# Patient Record
Sex: Female | Born: 1975 | Race: White | Hispanic: No | Marital: Single | Smoking: Never smoker
Health system: Southern US, Community
[De-identification: ages and names within clinical notes are randomized; demographics above are authoritative.]

## PROBLEM LIST (undated history)

## (undated) DIAGNOSIS — K589 Irritable bowel syndrome without diarrhea: Secondary | ICD-10-CM

## (undated) DIAGNOSIS — J45909 Unspecified asthma, uncomplicated: Secondary | ICD-10-CM

## (undated) DIAGNOSIS — F419 Anxiety disorder, unspecified: Secondary | ICD-10-CM

## (undated) DIAGNOSIS — K219 Gastro-esophageal reflux disease without esophagitis: Secondary | ICD-10-CM

## (undated) DIAGNOSIS — S27809A Unspecified injury of diaphragm, initial encounter: Secondary | ICD-10-CM

## (undated) DIAGNOSIS — E0500 Thyrotoxicosis with diffuse goiter without thyrotoxic crisis or storm: Secondary | ICD-10-CM

## (undated) DIAGNOSIS — I7300 Raynaud's syndrome without gangrene: Secondary | ICD-10-CM

## (undated) DIAGNOSIS — F329 Major depressive disorder, single episode, unspecified: Secondary | ICD-10-CM

## (undated) DIAGNOSIS — N3010 Interstitial cystitis (chronic) without hematuria: Secondary | ICD-10-CM

## (undated) DIAGNOSIS — S0300XA Dislocation of jaw, unspecified side, initial encounter: Secondary | ICD-10-CM

## (undated) DIAGNOSIS — S36119A Unspecified injury of liver, initial encounter: Secondary | ICD-10-CM

## (undated) DIAGNOSIS — F429 Obsessive-compulsive disorder, unspecified: Secondary | ICD-10-CM

## (undated) DIAGNOSIS — F4310 Post-traumatic stress disorder, unspecified: Secondary | ICD-10-CM

## (undated) DIAGNOSIS — F32A Depression, unspecified: Secondary | ICD-10-CM

## (undated) DIAGNOSIS — T7840XA Allergy, unspecified, initial encounter: Secondary | ICD-10-CM

## (undated) HISTORY — DX: Depression, unspecified: F32.A

## (undated) HISTORY — PX: GASTRECTOMY: SHX58

## (undated) HISTORY — PX: SPLENECTOMY, TOTAL: SHX788

## (undated) HISTORY — PX: TURBINATE REDUCTION: SHX6157

## (undated) HISTORY — PX: BLADDER SURGERY: SHX569

## (undated) HISTORY — DX: Allergy, unspecified, initial encounter: T78.40XA

## (undated) HISTORY — PX: ABDOMINAL HYSTERECTOMY: SHX81

---
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
## 2015-03-20 DIAGNOSIS — IMO0002 Reserved for concepts with insufficient information to code with codable children: Secondary | ICD-10-CM | POA: Insufficient documentation

## 2016-01-02 DIAGNOSIS — E890 Postprocedural hypothyroidism: Secondary | ICD-10-CM | POA: Insufficient documentation

## 2016-01-02 DIAGNOSIS — I7300 Raynaud's syndrome without gangrene: Secondary | ICD-10-CM | POA: Insufficient documentation

## 2016-01-02 DIAGNOSIS — M26609 Unspecified temporomandibular joint disorder, unspecified side: Secondary | ICD-10-CM | POA: Insufficient documentation

## 2016-01-02 DIAGNOSIS — N3010 Interstitial cystitis (chronic) without hematuria: Secondary | ICD-10-CM | POA: Insufficient documentation

## 2016-01-02 DIAGNOSIS — Z8639 Personal history of other endocrine, nutritional and metabolic disease: Secondary | ICD-10-CM | POA: Insufficient documentation

## 2016-01-02 DIAGNOSIS — K589 Irritable bowel syndrome without diarrhea: Secondary | ICD-10-CM | POA: Insufficient documentation

## 2016-01-02 DIAGNOSIS — Z923 Personal history of irradiation: Secondary | ICD-10-CM | POA: Insufficient documentation

## 2016-03-30 DIAGNOSIS — R102 Pelvic and perineal pain: Secondary | ICD-10-CM | POA: Insufficient documentation

## 2016-11-26 DIAGNOSIS — M6289 Other specified disorders of muscle: Secondary | ICD-10-CM | POA: Insufficient documentation

## 2017-10-01 DIAGNOSIS — T1490XA Injury, unspecified, initial encounter: Secondary | ICD-10-CM | POA: Insufficient documentation

## 2017-10-01 DIAGNOSIS — E039 Hypothyroidism, unspecified: Secondary | ICD-10-CM | POA: Insufficient documentation

## 2017-10-01 DIAGNOSIS — W3400XA Accidental discharge from unspecified firearms or gun, initial encounter: Secondary | ICD-10-CM | POA: Insufficient documentation

## 2017-10-04 DIAGNOSIS — F429 Obsessive-compulsive disorder, unspecified: Secondary | ICD-10-CM | POA: Insufficient documentation

## 2017-10-04 DIAGNOSIS — S27809A Unspecified injury of diaphragm, initial encounter: Secondary | ICD-10-CM | POA: Insufficient documentation

## 2017-10-04 DIAGNOSIS — S3633XA Laceration of stomach, initial encounter: Secondary | ICD-10-CM | POA: Insufficient documentation

## 2017-10-04 DIAGNOSIS — S36119A Unspecified injury of liver, initial encounter: Secondary | ICD-10-CM | POA: Insufficient documentation

## 2017-10-04 DIAGNOSIS — K219 Gastro-esophageal reflux disease without esophagitis: Secondary | ICD-10-CM | POA: Insufficient documentation

## 2017-11-24 DIAGNOSIS — Z5189 Encounter for other specified aftercare: Secondary | ICD-10-CM | POA: Insufficient documentation

## 2020-01-17 ENCOUNTER — Other Ambulatory Visit: Payer: Self-pay

## 2020-01-17 ENCOUNTER — Emergency Department (INDEPENDENT_AMBULATORY_CARE_PROVIDER_SITE_OTHER): Payer: Federal, State, Local not specified - PPO

## 2020-01-17 ENCOUNTER — Encounter: Payer: Self-pay | Admitting: *Deleted

## 2020-01-17 ENCOUNTER — Emergency Department
Admission: EM | Admit: 2020-01-17 | Discharge: 2020-01-17 | Disposition: A | Discharge status: Home / Self Care | Payer: Self-pay | Source: Home / Self Care | Attending: Family Medicine | Admitting: Family Medicine

## 2020-01-17 DIAGNOSIS — S92411A Displaced fracture of proximal phalanx of right great toe, initial encounter for closed fracture: Secondary | ICD-10-CM | POA: Diagnosis not present

## 2020-01-17 DIAGNOSIS — S92414A Nondisplaced fracture of proximal phalanx of right great toe, initial encounter for closed fracture: Secondary | ICD-10-CM | POA: Diagnosis not present

## 2020-01-17 DIAGNOSIS — W208XXA Other cause of strike by thrown, projected or falling object, initial encounter: Secondary | ICD-10-CM | POA: Diagnosis not present

## 2020-01-17 HISTORY — DX: Gastro-esophageal reflux disease without esophagitis: K21.9

## 2020-01-17 HISTORY — DX: Unspecified injury of liver, initial encounter: S36.119A

## 2020-01-17 HISTORY — DX: Interstitial cystitis (chronic) without hematuria: N30.10

## 2020-01-17 HISTORY — DX: Raynaud's syndrome without gangrene: I73.00

## 2020-01-17 HISTORY — DX: Irritable bowel syndrome, unspecified: K58.9

## 2020-01-17 HISTORY — DX: Post-traumatic stress disorder, unspecified: F43.10

## 2020-01-17 HISTORY — DX: Dislocation of jaw, unspecified side, initial encounter: S03.00XA

## 2020-01-17 HISTORY — DX: Thyrotoxicosis with diffuse goiter without thyrotoxic crisis or storm: E05.00

## 2020-01-17 HISTORY — DX: Anxiety disorder, unspecified: F41.9

## 2020-01-17 HISTORY — DX: Obsessive-compulsive disorder, unspecified: F42.9

## 2020-01-17 HISTORY — DX: Unspecified injury of diaphragm, initial encounter: S27.809A

## 2020-01-17 MED ORDER — HYDROCODONE-ACETAMINOPHEN 5-325 MG PO TABS: ORAL_TABLET | ORAL | 0 refills | Status: DC

## 2020-01-17 NOTE — ED Triage Notes (Signed)
Pt c/o RT great toe and foot injury x 10 days ago.

## 2020-01-17 NOTE — ED Provider Notes (Signed)
[facility]    CSN: [id number] Arrival date & time: 01/17/20  1962      History   Chief Complaint Chief Complaint  Patient presents with  . Foot Pain    HPI Alisha Smith is a 44 y.o. female.   Patient states that 12 days ago a heavy board fell on her right great toe and distal right foot.  The initial swelling has gradually improved but she has had persistent pain of the great toe.  The history is provided by the patient.  Toe Pain This is a new problem. The current episode started more than 1 week ago. The problem occurs constantly. The problem has not changed since onset.The symptoms are aggravated by walking and standing. Nothing relieves the symptoms. She has tried nothing for the symptoms.    Past Medical History:  Diagnosis Date  . Anxiety   . Diaphragm injury   . GERD (gastroesophageal reflux disease)   . Graves disease   . IBS (irritable bowel syndrome)   . Interstitial cystitis   . Liver injury   . OCD (obsessive compulsive disorder)   . PTSD (post-traumatic stress disorder)   . Raynaud disease   . TMJ (dislocation of temporomandibular joint)     There are no problems to display for this patient.   Past Surgical History:  Procedure Laterality Date  . ABDOMINAL HYSTERECTOMY    . BLADDER SURGERY    . GASTRECTOMY    . SPLENECTOMY, TOTAL    . TURBINATE REDUCTION      OB History   No obstetric history on file.      Home Medications    Prior to Admission medications   Medication Sig Start Date End Date Taking? Authorizing Provider  cetirizine (ZYRTEC) 10 MG tablet Take by mouth. 11/26/16  Yes [provider]  levothyroxine (SYNTHROID) 100 MCG tablet TAKE ONE TABLET (100 MCG) DAILY 06/12/19  Yes [provider]  omeprazole (PRILOSEC) 20 MG capsule Take 20 mg by mouth daily.   Yes [provider]  Biotin 1 MG CAPS Take by mouth.    [provider]  HYDROcodone-acetaminophen (NORCO/VICODIN) 5-325 MG  tablet Take one by mouth at bedtime as needed for pain 01/17/20   Lattie Haw, MD  Multiple Vitamin (MULTIVITAMIN) capsule Take by mouth.    [provider]  TRINTELLIX 20 MG TABS tablet Take 20 mg by mouth daily. 12/31/19   [provider]    Family History Family History  Problem Relation Age of Onset  . Diabetes Father     Social History Social History   Tobacco Use  . Smoking status: Never Smoker  . Smokeless tobacco: Never Used  Substance Use Topics  . Alcohol use: Not Currently  . Drug use: Never     Allergies   Patient has no known allergies.   Review of Systems Review of Systems  Musculoskeletal: Positive for joint swelling.  All other systems reviewed and are negative.    Physical Exam Triage Vital Signs ED Triage Vitals  Enc Vitals Group     BP 01/17/20 1015 130/82     Pulse Rate 01/17/20 1015 69     Resp 01/17/20 1015 16     Temp 01/17/20 1015 97.7 F (36.5 C)     Temp Source 01/17/20 1015 Oral     SpO2 01/17/20 1015 99 %     Weight 01/17/20 1016 141 lb (64 kg)     Height 01/17/20 1016 5' 3.5" (1.613  m)     Head Circumference --      Peak Flow --      Pain Score 01/17/20 1015 4     Pain Loc --      Pain Edu? --      Excl. in GC? --    No data found.  Updated Vital Signs BP 130/82 (BP Location: Right Arm)   Pulse 69   Temp 97.7 F (36.5 C) (Oral)   Resp 16   Ht 5' 3.5" (1.613 m)   Wt 64 kg   SpO2 99%   BMI 24.59 kg/m   Visual Acuity Right Eye Distance:   Left Eye Distance:   Bilateral Distance:    Right Eye Near:   Left Eye Near:    Bilateral Near:     Physical Exam Vitals and nursing note reviewed.  Constitutional:      General: She is not in acute distress. HENT:     Head: Normocephalic.  Eyes:     Pupils: Pupils are equal, round, and reactive to light.  Cardiovascular:     Rate and Rhythm: Normal rate.  Pulmonary:     Effort: Pulmonary effort is normal.  Musculoskeletal:       Feet:      Comments: Right great toe has tenderness to palpation over the IP joint and proximal phalanx distally.  Flexion/extension is intact.   Skin:    General: Skin is warm and dry.  Neurological:     Mental Status: She is alert.      UC Treatments / Results  Labs (all labs ordered are listed, but only abnormal results are displayed) Labs Reviewed - No data to display  EKG   Radiology DG Foot Complete Right  Result Date: 01/17/2020 CLINICAL DATA:  Board fell on foot with pain EXAM: RIGHT FOOT COMPLETE - 3+ VIEW COMPARISON:  None. FINDINGS: Frontal, oblique, and lateral views were obtained. There is a fracture along the lateral aspect of the distal portion of the first proximal phalanx with extension of the fracture in the first IP joint. Fracture fragments are in near anatomic alignment. No other fractures are evident. No dislocation. Joint spaces appear normal. No erosive change. IMPRESSION: Comminuted fracture of the lateral aspect of the distal portion of the first proximal phalanx with fracture fragments in near anatomic alignment. No other fractures are evident. No dislocation. No appreciable arthropathy. These results will be called to the ordering clinician or representative by the Radiologist Assistant, and communication documented in the PACS or zVision Dashboard. Electronically Signed   By: Bretta Bang III M.D.   On: 01/17/2020 10:41    Procedures Procedures (including critical care time)  Medications Ordered in UC Medications - No data to display  Initial Impression / Assessment and Plan / UC Course  I have reviewed the triage vital signs and the nursing notes.  Pertinent labs & imaging results that were available during my care of the patient were reviewed by me and considered in my medical decision making (see chart for details).    Dispensed post-op shoe.  Rx for Lortab at bedtime (#5, no refill) Controlled Substance Prescriptions I have consulted the [place] Controlled  Substances Registry for this patient, and feel the risk/benefit ratio today is favorable for proceeding with this prescription for a controlled substance.   Followup with Dr. Rodney [facility]ton ([facility]) for fracture management.   Final Clinical Impressions(s) / UC Diagnoses   Final diagnoses:  Closed nondisplaced fracture of proximal  phalanx of right great toe, initial encounter     Discharge Instructions     Wear post-op shoe.  May take Tylenol daytime as needed for pain    ED Prescriptions    Medication Sig Dispense Auth. Provider   HYDROcodone-acetaminophen (NORCO/VICODIN) 5-325 MG tablet Take one by mouth at bedtime as needed for pain 5 tablet Kandra Nicolas, MD        Kandra Nicolas, MD 01/17/20 [phone]

## 2020-01-17 NOTE — Discharge Instructions (Signed)
Wear post-op shoe.  May take Tylenol daytime as needed for pain

## 2020-01-19 ENCOUNTER — Other Ambulatory Visit: Payer: Self-pay

## 2020-01-19 ENCOUNTER — Encounter: Payer: Self-pay | Admitting: Sports Medicine

## 2020-01-19 ENCOUNTER — Ambulatory Visit (INDEPENDENT_AMBULATORY_CARE_PROVIDER_SITE_OTHER): Payer: Federal, State, Local not specified - PPO

## 2020-01-19 ENCOUNTER — Ambulatory Visit (INDEPENDENT_AMBULATORY_CARE_PROVIDER_SITE_OTHER): Payer: Federal, State, Local not specified - PPO | Admitting: Sports Medicine

## 2020-01-19 DIAGNOSIS — S92414A Nondisplaced fracture of proximal phalanx of right great toe, initial encounter for closed fracture: Secondary | ICD-10-CM

## 2020-01-19 DIAGNOSIS — S92401A Displaced unspecified fracture of right great toe, initial encounter for closed fracture: Secondary | ICD-10-CM | POA: Insufficient documentation
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
## 2020-02-16 ENCOUNTER — Encounter: Payer: Self-pay | Admitting: Sports Medicine

## 2020-02-16 ENCOUNTER — Ambulatory Visit (INDEPENDENT_AMBULATORY_CARE_PROVIDER_SITE_OTHER): Payer: Federal, State, Local not specified - PPO

## 2020-02-16 ENCOUNTER — Other Ambulatory Visit: Payer: Self-pay

## 2020-02-16 ENCOUNTER — Ambulatory Visit (INDEPENDENT_AMBULATORY_CARE_PROVIDER_SITE_OTHER): Payer: Federal, State, Local not specified - PPO | Admitting: Sports Medicine

## 2020-02-16 DIAGNOSIS — S92414A Nondisplaced fracture of proximal phalanx of right great toe, initial encounter for closed fracture: Secondary | ICD-10-CM

## 2020-02-16 DIAGNOSIS — S92414D Nondisplaced fracture of proximal phalanx of right great toe, subsequent encounter for fracture with routine healing: Secondary | ICD-10-CM

## 2020-02-16 NOTE — Assessment & Plan Note (Signed)
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 She had walked on it for a week, and she came to see me and we placed her in a postop shoe, she returns today doing much better, x-rays were personally reviewed and show stability of the fracture, she does have signs of new bone formation with callus seen around the proximal end of the fracture. There is a question of a slight increase in joint incongruity but I think this is artifactual and related to angle. Continue minimal weightbearing, though she has been in her regular shoe, and I like to see her back in a month with repeat x-rays obtained at that time.

## 2020-02-16 NOTE — Progress Notes (Signed)
   Impression and Recommendations:    I have performed independent interpretation of the relevant labs and imaging ordered by this patient's other providers.  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 She had walked on it for a week, and she came to see me and we placed her in a postop shoe, she returns today doing much better, x-rays were personally reviewed and show stability of the fracture, she does have signs of new bone formation with callus seen around the proximal end of the fracture. There is a question of a slight increase in joint incongruity but I think this is artifactual and related to angle. Continue minimal weightbearing, though she has been in her regular shoe, and I like to see her back in a month with repeat x-rays obtained at that time.    ___________________________________________ Ihor Austin. Benjamin Stain, M.D., ABFM., CAQSM. Primary Care and Sports Medicine [facility]  Adjunct Professor of Family Medicine  [employer]

## 2020-03-20 ENCOUNTER — Other Ambulatory Visit: Payer: Self-pay

## 2020-03-20 ENCOUNTER — Ambulatory Visit (INDEPENDENT_AMBULATORY_CARE_PROVIDER_SITE_OTHER): Payer: Federal, State, Local not specified - PPO

## 2020-03-20 ENCOUNTER — Encounter: Payer: Self-pay | Admitting: Sports Medicine

## 2020-03-20 ENCOUNTER — Ambulatory Visit (INDEPENDENT_AMBULATORY_CARE_PROVIDER_SITE_OTHER): Payer: Federal, State, Local not specified - PPO | Admitting: Sports Medicine

## 2020-03-20 DIAGNOSIS — S92414D Nondisplaced fracture of proximal phalanx of right great toe, subsequent encounter for fracture with routine healing: Secondary | ICD-10-CM

## 2020-03-20 NOTE — Assessment & Plan Note (Signed)
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 Today her fracture continues to look improved, there is decreased prominence of the fracture line, good bony callus, and she only has minimal tenderness around the fracture. I think she can return to see me on an as-needed basis.

## 2020-03-20 NOTE — Progress Notes (Signed)
    Procedures performed today:    None.  Independent interpretation of notes and tests performed by another provider:   None.  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 Today her fracture continues to look improved, there is decreased prominence of the fracture line, good bony callus, and she only has minimal tenderness around the fracture. I think she can return to see me on an as-needed basis.    ___________________________________________ Ihor Austin. Benjamin Stain, M.D., ABFM., CAQSM. Primary Care and Sports Medicine [facility]  Adjunct Instructor of Family Medicine  [employer]

## 2020-04-29 DIAGNOSIS — U071 COVID-19: Secondary | ICD-10-CM | POA: Insufficient documentation

## 2020-07-23 DIAGNOSIS — K439 Ventral hernia without obstruction or gangrene: Secondary | ICD-10-CM | POA: Insufficient documentation

## 2020-08-14 DIAGNOSIS — R002 Palpitations: Secondary | ICD-10-CM | POA: Insufficient documentation

## 2020-08-15 DIAGNOSIS — U099 Post covid-19 condition, unspecified: Secondary | ICD-10-CM | POA: Insufficient documentation

## 2020-08-26 DIAGNOSIS — K8020 Calculus of gallbladder without cholecystitis without obstruction: Secondary | ICD-10-CM | POA: Insufficient documentation

## 2020-12-24 DIAGNOSIS — F32A Depression, unspecified: Secondary | ICD-10-CM | POA: Insufficient documentation

## 2021-01-28 ENCOUNTER — Other Ambulatory Visit: Payer: Self-pay | Admitting: Internal Medicine

## 2021-01-28 DIAGNOSIS — Z1231 Encounter for screening mammogram for malignant neoplasm of breast: Secondary | ICD-10-CM

## 2021-04-10 ENCOUNTER — Ambulatory Visit
Admission: RE | Admit: 2021-04-10 | Discharge: 2021-04-10 | Disposition: A | Discharge status: Home / Self Care | Payer: Federal, State, Local not specified - PPO | Source: Ambulatory Visit | Attending: Internal Medicine | Admitting: Internal Medicine

## 2021-04-10 ENCOUNTER — Other Ambulatory Visit: Payer: Self-pay

## 2021-04-10 DIAGNOSIS — Z1231 Encounter for screening mammogram for malignant neoplasm of breast: Secondary | ICD-10-CM

## 2021-07-02 ENCOUNTER — Other Ambulatory Visit: Payer: Self-pay

## 2021-07-02 ENCOUNTER — Emergency Department (HOSPITAL_COMMUNITY)
Admission: EM | Admit: 2021-07-02 | Discharge: 2021-07-02 | Disposition: A | Discharge status: Home / Self Care | Payer: Federal, State, Local not specified - PPO | Attending: Emergency Medicine | Admitting: Emergency Medicine

## 2021-07-02 ENCOUNTER — Encounter (HOSPITAL_COMMUNITY): Payer: Self-pay | Admitting: Emergency Medicine

## 2021-07-02 DIAGNOSIS — Z79899 Other long term (current) drug therapy: Secondary | ICD-10-CM | POA: Insufficient documentation

## 2021-07-02 DIAGNOSIS — Z91010 Allergy to peanuts: Secondary | ICD-10-CM | POA: Insufficient documentation

## 2021-07-02 DIAGNOSIS — E890 Postprocedural hypothyroidism: Secondary | ICD-10-CM | POA: Diagnosis not present

## 2021-07-02 DIAGNOSIS — L03116 Cellulitis of left lower limb: Secondary | ICD-10-CM

## 2021-07-02 DIAGNOSIS — J45909 Unspecified asthma, uncomplicated: Secondary | ICD-10-CM | POA: Diagnosis not present

## 2021-07-02 DIAGNOSIS — Z91040 Latex allergy status: Secondary | ICD-10-CM | POA: Diagnosis not present

## 2021-07-02 DIAGNOSIS — M79672 Pain in left foot: Secondary | ICD-10-CM | POA: Diagnosis present

## 2021-07-02 HISTORY — DX: Unspecified asthma, uncomplicated: J45.909

## 2021-07-02 MED ORDER — STERILE WATER FOR INJECTION IJ SOLN
INTRAMUSCULAR | Status: AC
  Filled 2021-07-02: qty 10

## 2021-07-02 MED ORDER — CEPHALEXIN 500 MG PO CAPS: 500.0000 mg | ORAL_CAPSULE | Freq: Four times a day (QID) | ORAL | 0 refills | Status: DC

## 2021-07-02 MED ORDER — CEFTRIAXONE SODIUM 1 G IJ SOLR
500.0000 mg | Freq: Once | INTRAMUSCULAR | Status: AC
  Administered 2021-07-02: 500 mg via INTRAMUSCULAR
  Filled 2021-07-02: qty 10

## 2021-07-02 NOTE — ED Triage Notes (Addendum)
Pt reports left foot pain that began 7/29. Was seen by PCP, but sx have not resolved. Left foot is swollen and warm to touch. Pain and swelling spreading up leg into calf area. Denies HX of blood clots. Not on BC. Denies injury to area.

## 2021-07-02 NOTE — Discharge Instructions (Addendum)
We saw in the ER for swelling of your foot.  It appears to [place] that you likely have cellulitis.  Take the antibiotics prescribed starting tonight.  See your primary care doctor in 3 to 5 days if the symptoms are not improving.  Return to the ER immediately if you start having worsening of the swelling, or you start having chills, sweats, and high-grade fevers.
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
## 2021-07-10 ENCOUNTER — Encounter (HOSPITAL_COMMUNITY): Payer: Self-pay

## 2021-07-10 ENCOUNTER — Other Ambulatory Visit: Payer: Self-pay

## 2021-07-10 ENCOUNTER — Ambulatory Visit (HOSPITAL_COMMUNITY)
Admission: EM | Admit: 2021-07-10 | Discharge: 2021-07-10 | Disposition: A | Discharge status: Home / Self Care | Payer: Federal, State, Local not specified - PPO | Attending: Emergency Medicine | Admitting: Emergency Medicine

## 2021-07-10 DIAGNOSIS — L03119 Cellulitis of unspecified part of limb: Secondary | ICD-10-CM | POA: Diagnosis not present

## 2021-07-10 MED ORDER — CEPHALEXIN 500 MG PO CAPS: 500.0000 mg | ORAL_CAPSULE | Freq: Four times a day (QID) | ORAL | 0 refills | Status: AC

## 2021-07-10 MED ORDER — HYDROCODONE-ACETAMINOPHEN 5-325 MG PO TABS: 1.0000 | ORAL_TABLET | Freq: Four times a day (QID) | ORAL | 0 refills | Status: DC | PRN

## 2021-07-10 NOTE — ED Triage Notes (Signed)
Pt presents with left foot pain and swelling. States she was diagnosed with cellulitis. States Saturday she felt better tham other days and states she was given Keflex.
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
## 2021-07-10 NOTE — Discharge Instructions (Addendum)
I am extending your Keflex out to 14 days.  You were originally prescribed 7-day course.  If your foot is completely better by day #10, then you can discontinue the Keflex.  Otherwise, finish at all.  Try topical Voltaren as directed for pain and swelling.  Get Tylenol containing product 3-4 times a day as needed for pain.  Either 1000 mg of Tylenol for mild to moderate pain or one half Norco for severe pain.  Do not take Tylenol and Norco as they both have Tylenol in them.  To much Tylenol can hurt your liver.  Do not exceed more than 4000 mg of Tylenol from all sources in 1 day.  Follow-up with your doctor as needed.  Go to the ER if you start having fevers, worsening symptoms, or other concern

## 2021-07-20 ENCOUNTER — Telehealth: Payer: Federal, State, Local not specified - PPO | Admitting: Physician Assistant

## 2021-07-20 DIAGNOSIS — U071 COVID-19: Secondary | ICD-10-CM | POA: Diagnosis not present

## 2021-07-20 MED ORDER — PREDNISONE 20 MG PO TABS
40.0000 mg | ORAL_TABLET | Freq: Every day | ORAL | 0 refills | Status: DC
Start: 2021-07-20 — End: 2024-02-22

## 2021-07-20 MED ORDER — FLUTICASONE PROPIONATE 50 MCG/ACT NA SUSP: 2.0000 | Freq: Every day | NASAL | 0 refills | Status: DC

## 2021-07-20 MED ORDER — BENZONATATE 100 MG PO CAPS
100.0000 mg | ORAL_CAPSULE | Freq: Three times a day (TID) | ORAL | 0 refills | Status: DC | PRN
Start: 2021-07-20 — End: 2024-02-22

## 2021-07-20 MED ORDER — MOLNUPIRAVIR EUA 200MG CAPSULE: 4.0000 | ORAL_CAPSULE | Freq: Two times a day (BID) | ORAL | 0 refills | Status: AC

## 2021-07-20 NOTE — Patient Instructions (Signed)
Hello Alisha Smith,  You are being placed in the home monitoring program for COVID-19 (commonly known as Coronavirus).  This is because you are suspected to have the virus or are known to have the virus.  If you are unsure which group you fall into call your clinic.    As part of this program, you'll answer a daily questionnaire in the MyChart mobile app. You'll receive a notification through the MyChart app when the questionnaire is available. When you log in to MyChart, you'll see the tasks in your To Do activity.       Clinicians will see any answers that are concerning and take appropriate steps.  If at any point you are having a medical emergency, call 911.  If otherwise concerned call your clinic instead of coming into the clinic or hospital.  To keep from spreading the disease you should: Stay home and limit contact with other people as much as possible.  Wash your hands frequently. Cover your coughs and sneezes with a tissue, and throw used tissues in the trash.   Clean and disinfect frequently touched surfaces and objects.    Take care of yourself by: Staying home Resting Drinking fluids Take fever-reducing medications (Tylenol/Acetaminophen and Ibuprofen)  For more information on the disease go to the Centers for Disease Control and Prevention website     COVID-19: What to Do if You Are Sick CDC has updated isolation and quarantine recommendations for the public, and is revising the CDC website to reflect these changes. These recommendations do not apply to healthcare personnel and do not supersede state, local, tribal, or territorial laws, rules, andregulations. If you have a fever, cough or other symptoms, you might have COVID-19. Most people have mild illness and are able to recover at home. If you are sick: Keep track of your symptoms. If you have an emergency warning sign (including trouble breathing), call 911. Steps to help prevent the spread of COVID-19 if you are sick If  you are sick with COVID-19 or think you might have COVID-19, follow the steps below to care for yourself and to help protect other peoplein your home and community. Stay home except to get medical care Stay home. Most people with COVID-19 have mild illness and can recover at home without medical care. Do not leave your home, except to get medical care. Do not visit public areas. Take care of yourself. Get rest and stay hydrated. Take over-the-counter medicines, such as acetaminophen, to help you feel better. Stay in touch with your doctor. Call before you get medical care. Be sure to get care if you have trouble breathing, or have any other emergency warning signs, or if you think it is an emergency. Avoid public transportation, ride-sharing, or taxis. Separate yourself from other people As much as possible, stay in a specific room and away from other people and pets in your home. If possible, you should use a separate bathroom. If you need to be around other people or animals in oroutside of the home, wear a mask. Tell your close contactsthat they may have been exposed to COVID-19. An infected person can spread COVID-19 starting 48 hours (or 2 days) before the person has any symptoms or tests positive. By letting your close contacts know they may have been exposed to COVID-19, you are helping to protect everyone. Additional guidance is available for those living in close quarters and shared housing. See COVID-19 and Animals if you have questions about pets. If you are diagnosed with  COVID-19, someone from the health department may call you. Answer the call to slow the spread. Monitor your symptoms Symptoms of COVID-19 include fever, cough, or other symptoms. Follow care instructions from your healthcare provider and local health department. Your local health authorities may give instructions on checking your symptoms and reporting information. When to seek emergency medical attention Look for  emergency warning signs* for COVID-19. If someone is showing any of these signs, seek emergency medical care immediately: Trouble breathing Persistent pain or pressure in the chest New confusion Inability to wake or stay awake Pale, gray, or blue-colored skin, lips, or nail beds, depending on skin tone *This list is not all possible symptoms. Please call your medical provider forany other symptoms that are severe or concerning to you. Call 911 or call ahead to your local emergency facility: Notify the operator that you are seeking care for someone who has or may haveCOVID-19. Call ahead before visiting your doctor Call ahead. Many medical visits for routine care are being postponed or done by phone or telemedicine. If you have a medical appointment that cannot be postponed, call your doctor's office, and tell them you have or may have COVID-19. This will help the office protect themselves and other patients. Get tested If you have symptoms of COVID-19, get tested. While waiting for test results, you stay away from others, including staying apart from those living in your household. Self-tests are one of several options for testing for the virus that causes COVID-19 and may be more convenient than laboratory-based tests and point-of-care tests. Ask your healthcare provider or your local health department if you need help interpreting your test results. You can visit your state, tribal, local, and territorial health department's website to look for the latest local information on testing sites. If you are sick, wear a mask over your nose and mouth You should wear a mask over your nose and mouth if you must be around other people or animals, including pets (even at home). You don't need to wear the mask if you are alone. If you can't put on a mask (because of trouble breathing, for example), cover your coughs and sneezes in some other way. Try to stay at least 6 feet away from other people. This will  help protect the people around you. Masks should not be placed on young children under age 2 years, anyone who has trouble breathing, or anyone who is not able to remove the mask without help. Note: During the COVID-19 pandemic, medical grade facemasks are reserved forhealthcare workers and some first responders. Cover your coughs and sneezes Cover your mouth and nose with a tissue when you cough or sneeze. Throw away used tissues in a lined trash can. Immediately wash your hands with soap and water for at least 20 seconds. If soap and water are not available, clean your hands with an alcohol-based hand sanitizer that contains at least 60% alcohol. Clean your hands often Wash your hands often with soap and water for at least 20 seconds. This is especially important after blowing your nose, coughing, or sneezing; going to the bathroom; and before eating or preparing food. Use hand sanitizer if soap and water are not available. Use an alcohol-based hand sanitizer with at least 60% alcohol, covering all surfaces of your hands and rubbing them together until they feel dry. Soap and water are the best option, especially if hands are visibly dirty. Avoid touching your eyes, nose, and mouth with unwashed hands. Handwashing Tips Avoid sharing   personal household items Do not share dishes, drinking glasses, cups, eating utensils, towels, or bedding with other people in your home. Wash these items thoroughly after using them with soap and water or put in the dishwasher. Clean all "high-touch" surfaces every day Clean and disinfect high-touch surfaces in your "sick room" and bathroom; wear disposable gloves. Let someone else clean and disinfect surfaces in common areas, but you should clean your bedroom and bathroom, if possible. If a caregiver or other person needs to clean and disinfect a sick person's bedroom or bathroom, they should do so on an as-needed basis. The caregiver/other person should wear a mask  and disposable gloves prior to cleaning. They should wait as long as possible after the person who is sick has used the bathroom before coming in to clean and use the bathroom. High-touch surfaces include phones, remote controls, counters, tabletops, doorknobs, bathroom fixtures, toilets, keyboards, tablets, and bedside tables. Clean and disinfect areas that may have blood, stool, or body fluids on them. Use household cleaners and disinfectants. Clean the area or item with soap and water or another detergent if it is dirty. Then, use a household disinfectant. Be sure to follow the instructions on the label to ensure safe and effective use of the product. Many products recommend keeping the surface wet for several minutes to ensure germs are killed. Many also recommend precautions such as wearing gloves and making sure you have good ventilation during use of the product. Use a product from [employer] List [employer]: Disinfectants for Coronavirus ([id number]). Complete Disinfection Guidance When you can be around others after being sick with COVID-19 Deciding when you can be around others is different for different situations. Find out when you can safely end home isolation. For any additional questions about your care,contact your healthcare provider or state or local health department. 11/06/2020 Content source: [facility] ([facility]), Division of Viral Diseases This information is not intended to replace advice given to you by your health care provider. Make sure you discuss any questions you have with your healthcare provider. Document Revised: 01/03/2021 Document Reviewed: 01/03/2021 Elsevier Patient Education  2022 [place] are being prescribed MOLNUPIRAVIR for COVID-19 infection.   Please call the pharmacy or go through the drive through vs going inside if you are picking up the mediation yourself to prevent further spread. If prescribed to a [facility] affiliated pharmacy, a pharmacist will bring the medication out to your car.   ADMINISTRATION INSTRUCTIONS: Take with or without food. Swallow the tablets whole. Don't chew, crush, or break the medications because it might not work as well  For each dose of the medication, you should be taking FOUR tablets at one time, TWICE a day   Finish your full five-day course of Molnupiravir even if you feel better before you're done. Stopping this medication too early can make it less effective to prevent severe illness related to [place].    Molnupiravir is prescribed for YOU ONLY. Don't share it with others, even if they have similar symptoms as you. This medication might not be right for everyone.   Make sure to take steps to protect yourself and others while you're taking this medication in order to get well soon and to prevent others from getting sick with COVID-19.   **If you are of childbearing potential (any gender) - it is advised to not get pregnant while taking this medication and recommended that condoms are used for female partners the  next 3 months after taking the medication out of extreme caution    COMMON SIDE EFFECTS: Diarrhea Nausea  Dizziness    If your COVID-19 symptoms get worse, get medical help right away. Call 911 if you experience symptoms such as worsening cough, trouble breathing, chest pain that doesn't go away, confusion, a hard time staying awake, and pale or blue-colored skin. This medication won't prevent all COVID-19 cases from getting worse.   Can take to lessen severity: Vit C '500mg'$  twice daily Quercertin 250-'500mg'$  twice daily Zinc 75-'100mg'$  daily Melatonin 3-6 mg at bedtime Vit D3 1000-2000 IU daily Aspirin 81 mg daily with food Optional: Famotidine '20mg'$  daily Also can add tylenol/ibuprofen as needed for fevers and body aches May add Mucinex or Mucinex DM as needed for cough/congestion  10 Things You Can Do to Manage Your COVID-19 Symptoms at  Home If you have possible or confirmed COVID-19 Stay home except to get medical care. Monitor your symptoms carefully. If your symptoms get worse, call your healthcare provider immediately. Get rest and stay hydrated. If you have a medical appointment, call the healthcare provider ahead of time and tell them that you have or may have COVID-19. For medical emergencies, call 911 and notify the dispatch personnel that you have or may have COVID-19. Cover your cough and sneezes with a tissue or use the inside of your elbow. Wash your hands often with soap and water for at least 20 seconds or clean your hands with an alcohol-based hand sanitizer that contains at least 60% alcohol. As much as possible, stay in a specific room and away from other people in your home. Also, you should use a separate bathroom, if available. If you need to be around other people in or outside of the home, wear a mask. Avoid sharing personal items with other people in your household, like dishes, towels, and bedding. Clean all surfaces that are touched often, like counters, tabletops, and doorknobs. Use household cleaning sprays or wipes according to the label instructions. June 06/14/2020 This information is not intended to replace advice given to you by your health care provider. Make sure you discuss any questions you have with your healthcare provider. Document Revised: 01/03/2021 Document Reviewed: 01/03/2021 Elsevier Patient Education  [place].
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
## 2021-07-20 NOTE — Progress Notes (Signed)
Patient was notified x 3 and provider waited over 20 minutes without any attempt to connect. Marked No Show

## 2021-08-07 ENCOUNTER — Emergency Department (HOSPITAL_COMMUNITY): Payer: Federal, State, Local not specified - PPO

## 2021-08-07 ENCOUNTER — Emergency Department (HOSPITAL_COMMUNITY)
Admission: EM | Admit: 2021-08-07 | Discharge: 2021-08-08 | Disposition: A | Discharge status: Home / Self Care | Payer: Federal, State, Local not specified - PPO | Attending: Emergency Medicine | Admitting: Emergency Medicine

## 2021-08-07 ENCOUNTER — Other Ambulatory Visit: Payer: Self-pay

## 2021-08-07 DIAGNOSIS — E039 Hypothyroidism, unspecified: Secondary | ICD-10-CM | POA: Insufficient documentation

## 2021-08-07 DIAGNOSIS — D72829 Elevated white blood cell count, unspecified: Secondary | ICD-10-CM | POA: Insufficient documentation

## 2021-08-07 DIAGNOSIS — Z91040 Latex allergy status: Secondary | ICD-10-CM | POA: Insufficient documentation

## 2021-08-07 DIAGNOSIS — J45909 Unspecified asthma, uncomplicated: Secondary | ICD-10-CM | POA: Diagnosis not present

## 2021-08-07 DIAGNOSIS — R2242 Localized swelling, mass and lump, left lower limb: Secondary | ICD-10-CM | POA: Insufficient documentation

## 2021-08-07 DIAGNOSIS — Z79899 Other long term (current) drug therapy: Secondary | ICD-10-CM | POA: Insufficient documentation

## 2021-08-07 DIAGNOSIS — Z8616 Personal history of COVID-19: Secondary | ICD-10-CM | POA: Diagnosis not present

## 2021-08-07 DIAGNOSIS — R102 Pelvic and perineal pain: Secondary | ICD-10-CM | POA: Insufficient documentation

## 2021-08-07 DIAGNOSIS — R197 Diarrhea, unspecified: Secondary | ICD-10-CM

## 2021-08-07 DIAGNOSIS — R059 Cough, unspecified: Secondary | ICD-10-CM | POA: Diagnosis not present

## 2021-08-07 LAB — I-STAT BETA HCG BLOOD, ED (MC, WL, AP ONLY): I-stat hCG, quantitative: <5 m[IU]/mL (ref <5)

## 2021-08-07 LAB — COMPREHENSIVE METABOLIC PANEL
ALT: 15 U/L (ref 0–44)
AST: 20 U/L (ref 15–41)
Albumin: 3.8 g/dL (ref 3.5–5.0)
Alkaline Phosphatase: 74 U/L (ref 38–126)
Anion gap: 7 (ref 5–15)
BUN: 6 mg/dL (ref 6–20)
CO2: 28 mmol/L (ref 22–32)
Calcium: 9.4 mg/dL (ref 8.9–10.3)
Chloride: 102 mmol/L (ref 98–111)
Creatinine, Ser: 0.78 mg/dL (ref 0.44–1.00)
GFR, Estimated: >60 mL/min (ref >60)
Glucose, Bld: 86 mg/dL (ref 70–99)
Potassium: 4.3 mmol/L (ref 3.5–5.1)
Sodium: 137 mmol/L (ref 135–145)
Total Bilirubin: 0.5 mg/dL (ref 0.3–1.2)
Total Protein: 7.4 g/dL (ref 6.5–8.1)

## 2021-08-07 LAB — URINALYSIS, MICROSCOPIC (REFLEX): Bacteria, UA: NONE SEEN

## 2021-08-07 LAB — CBC
HCT: 40.6 % (ref 36.0–46.0)
Hemoglobin: 13.1 g/dL (ref 12.0–15.0)
MCH: 30.8 pg (ref 26.0–34.0)
MCHC: 32.3 g/dL (ref 30.0–36.0)
MCV: 95.5 fL (ref 80.0–100.0)
Platelets: 326 10*3/uL (ref 150–400)
RBC: 4.25 MIL/uL (ref 3.87–5.11)
RDW: 14.3 % (ref 11.5–15.5)
WBC: 14.7 10*3/uL — ABNORMAL HIGH (ref 4.0–10.5)
nRBC: 0 % (ref 0.0–0.2)

## 2021-08-07 LAB — URINALYSIS, ROUTINE W REFLEX MICROSCOPIC
Bilirubin Urine: NEGATIVE
Glucose, UA: NEGATIVE mg/dL
Ketones, ur: NEGATIVE mg/dL
Leukocytes,Ua: NEGATIVE
Nitrite: NEGATIVE
Protein, ur: NEGATIVE mg/dL
Specific Gravity, Urine: <1.005 — ABNORMAL LOW (ref 1.005–1.030)
pH: 6 (ref 5.0–8.0)

## 2021-08-07 LAB — LIPASE, BLOOD: Lipase: 39 U/L (ref 11–51)

## 2021-08-07 MED ORDER — GADOBUTROL 1 MMOL/ML IV SOLN
6.4000 mL | Freq: Once | INTRAVENOUS | Status: AC | PRN
  Administered 2021-08-07: 6.4 mL via INTRAVENOUS

## 2021-08-07 NOTE — ED Provider Notes (Signed)
Emergency Medicine Provider Triage Evaluation Note  Alisha Smith , a 45 y.o. female  was evaluated in triage.  Pt with several complaints. She has persistent cough and SOB after dx with COVID on 8/21. Hx of asthma. Was dx with C. Diff and on vancomycin, finished 2 weeks ago. Complaining of persistent diarrhea, 8-9 times per day. Dx with cellulitis on left foot on 8/5, complaining of persistent pain and swelling.  Review of Systems  Positive: Mouth sores, cough, congestion, SOB, abdominal pain, N/V, diarrhea, left foot pain and swelling Negative: Fevers, chills, CP  Physical Exam  BP (!) 145/92 (BP Location: Left Arm)   Pulse 99   Temp 98.6 F (37 C) (Oral)   Resp 20   SpO2 100%  Gen:   Awake, no distress   Resp:  Normal effort, persistent coughing during exam MSK:   Moves extremities without difficulty  Other:  Erythema of dorsum of left foot without obvious swelling  Medical Decision Making  Medically screening exam initiated at 5:17 PM.  Appropriate orders placed.  Ayala Ribble was informed that the remainder of the evaluation will be completed by another provider, this initial triage assessment does not replace that evaluation, and the importance of remaining in the ED until their evaluation is complete.     Jeanella Flattery 08/07/21 1733    Arby Barrette, MD 08/07/21 2351

## 2021-08-07 NOTE — ED Triage Notes (Signed)
C/O left foot pain; endorsed recent dx and tx of cellulitis and has ongoing pain. Patient c/o progressive cough post covid.

## 2021-08-08 MED ORDER — DEXAMETHASONE 4 MG PO TABS
10.0000 mg | ORAL_TABLET | Freq: Once | ORAL | Status: AC
  Administered 2021-08-08: 10 mg via ORAL
  Filled 2021-08-08: qty 3
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
## 2021-08-08 NOTE — ED Notes (Signed)
DC instructions reviewed with pt. PT verbalized understanding. PT DC °

## 2021-08-08 NOTE — Discharge Instructions (Addendum)
Please resume using your inhaled steroid.  The preliminary report on your MRI does not show significant cellulitis or osteomyelitis (infection in the bone).  Please check my chart tomorrow for the official report on your MRI scan.  Please follow-up with your gastroenterologist regarding your persistent diarrhea.  Please follow-up with the infections disease specialist regarding both your diarrhea and the persistent redness and swelling of your foot.

## 2021-08-29 DIAGNOSIS — Z9081 Acquired absence of spleen: Secondary | ICD-10-CM | POA: Insufficient documentation

## 2021-09-04 ENCOUNTER — Ambulatory Visit: Payer: Federal, State, Local not specified - PPO | Admitting: Internal Medicine

## 2021-09-23 ENCOUNTER — Other Ambulatory Visit: Payer: Self-pay

## 2021-09-23 ENCOUNTER — Encounter: Payer: Self-pay | Admitting: Allergy

## 2021-09-23 ENCOUNTER — Ambulatory Visit: Payer: Federal, State, Local not specified - PPO | Admitting: Allergy

## 2021-09-23 VITALS — BP 124/82 | HR 86 | Temp 98.0°F | Resp 18 | Ht 64.0 in | Wt 139.0 lb

## 2021-09-23 DIAGNOSIS — Z7185 Encounter for immunization safety counseling: Secondary | ICD-10-CM | POA: Insufficient documentation

## 2021-09-23 DIAGNOSIS — J45909 Unspecified asthma, uncomplicated: Secondary | ICD-10-CM | POA: Diagnosis not present

## 2021-09-23 DIAGNOSIS — K219 Gastro-esophageal reflux disease without esophagitis: Secondary | ICD-10-CM | POA: Diagnosis not present

## 2021-09-23 DIAGNOSIS — T7800XD Anaphylactic reaction due to unspecified food, subsequent encounter: Secondary | ICD-10-CM

## 2021-09-23 DIAGNOSIS — J3089 Other allergic rhinitis: Secondary | ICD-10-CM | POA: Diagnosis not present

## 2021-09-23 DIAGNOSIS — T7800XA Anaphylactic reaction due to unspecified food, initial encounter: Secondary | ICD-10-CM | POA: Insufficient documentation

## 2021-09-23 DIAGNOSIS — Z8619 Personal history of other infectious and parasitic diseases: Secondary | ICD-10-CM | POA: Insufficient documentation

## 2021-09-23 MED ORDER — EPINEPHRINE 0.3 MG/0.3ML IJ SOAJ: 0.3000 mg | INTRAMUSCULAR | 2 refills | Status: AC | PRN

## 2021-09-23 MED ORDER — SPIRIVA RESPIMAT 1.25 MCG/ACT IN AERS: 2.0000 | INHALATION_SPRAY | Freq: Every day | RESPIRATORY_TRACT | 3 refills | Status: DC

## 2021-09-23 MED ORDER — RYALTRIS 665-25 MCG/ACT NA SUSP: 1.0000 | Freq: Two times a day (BID) | NASAL | 5 refills | Status: AC

## 2021-09-23 NOTE — Assessment & Plan Note (Addendum)
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 Briefly on AIT 15+ years ago. Sinus surgery in the past. 2 dogs, 1 cat at home.  Today's skin testing showed: Positive to grass, ragweed, weed, trees, mold, dust mites, cat ,dog.  Start environmental control measures as below.  Use over the counter antihistamines such as Zyrtec (cetirizine), Claritin (loratadine), Allegra (fexofenadine), or Xyzal (levocetirizine) daily as needed. May take twice a day during allergy flares. May switch antihistamines every few months. . Start Ryaltris (olopatadine + mometasone nasal spray combination) 1 spray per nostril twice a day. Sample given.  o If it's not covered let us know.   Consider allergy injections for long term control if above medications do not help the symptoms - handout given.   Check insurance coverage.   Will need asthma to be in better control before starting.
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
## 2021-09-23 NOTE — Assessment & Plan Note (Signed)
   Discussed risks and benefits of Covid-19 vaccination.

## 2021-09-23 NOTE — Assessment & Plan Note (Addendum)
Diagnosed with asthma <1 year ago after 2 bouts of Covid-19 infection. Not vaccinated. Currently on Symbicort 2 puffs Bid and albuterol use daily with good benefit. Takes omeprazole 40mg  BID. 08/07/2021 CXR normal. Concerned about allergic triggers.  Today's skin testing showed: Positive to grass, ragweed, weed, trees, mold, dust mites, cat ,dog.  Today's spirometry was normal.  . Continue to follow up with pulmonology . Daily controller medication(s): Symbicort 10/07/2021 2 puffs twice a day with spacer and rinse mouth afterwards. o Start Spiriva 1.6mcg 2 puffs once a day. Sample given. Demonstrated proper use.  . May use albuterol rescue inhaler 2 puffs every 4 to 6 hours as needed for shortness of breath, chest tightness, coughing, and wheezing. May use albuterol rescue inhaler 2 puffs 5 to 15 minutes prior to strenuous physical activities. Monitor frequency of use.  . Get spirometry at next visit. . If no improvement, will discuss whether patient would benefit from biologics.
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
## 2021-09-23 NOTE — Assessment & Plan Note (Signed)
   See handout for lifestyle and dietary modifications.  Continue omeprazole 40mg  twice a day.

## 2021-09-23 NOTE — Assessment & Plan Note (Signed)
Peanuts caused throat swelling and trouble breathing in the past. Okay with foods fried in peanut oil. Also avoiding tree nuts - no prior known exposure.  Today's skin testing showed: Negative to peanuts. Borderline positive to walnut, almond and hazelnut. . Continue to avoid peanuts, tree nuts. . I have prescribed epinephrine injectable device and demonstrated proper use. For mild symptoms you can take over the counter antihistamines such as Benadryl and monitor symptoms closely. If symptoms worsen or if you have severe symptoms including breathing issues, throat closure, significant swelling, whole body hives, severe diarrhea and vomiting, lightheadedness then inject epinephrine and seek immediate medical care afterwards. . Emergency action plan given. . Get bloodwork o If negative will schedule for in office food challenge.

## 2021-09-23 NOTE — Patient Instructions (Addendum)
Today's skin testing showed: Positive to grass, ragweed, weed, trees, mold, dust mites, cat ,dog. Negative to peanuts. Borderline positive to walnut, almond and hazelnut. Results given.   Environmental allergies Start environmental control measures as below. Use over the counter antihistamines such as Zyrtec (cetirizine), Claritin (loratadine), Allegra (fexofenadine), or Xyzal (levocetirizine) daily as needed. May take twice a day during allergy flares. May switch antihistamines every few months. Start Ryaltris (olopatadine + mometasone nasal spray combination) 1 spray per nostril twice a day. Sample given.  If it's not covered let us know.  Consider allergy injections for long term control if above medications do not help the symptoms - handout given.  Check insurance coverage.   Asthma Continue to follow up with pulmonology Daily controller medication(s): Symbicort 2 puffs twice a day with spacer and rinse mouth afterwards. Start Spiriva 1.37mcg 2 puffs once a day. Sample given. Demonstrated proper use.   May use albuterol rescue inhaler 2 puffs every 4 to 6 hours as needed for shortness of breath, chest tightness, coughing, and wheezing. May use albuterol rescue inhaler 2 puffs 5 to 15 minutes prior to strenuous physical activities. Monitor frequency of use.  Asthma control goals:  Full participation in all desired activities (may need albuterol before activity) Albuterol use two times or less a week on average (not counting use with activity) Cough interfering with sleep two times or less a month Oral steroids no more than once a year No hospitalizations   Food allergy Continue to avoid peanuts, tree nuts. I have prescribed epinephrine injectable device and demonstrated proper use. For mild symptoms you can take over the counter antihistamines such as Benadryl and monitor symptoms closely. If symptoms worsen or if you have severe symptoms including breathing issues, throat  closure, significant swelling, whole body hives, severe diarrhea and vomiting, lightheadedness then inject epinephrine and seek immediate medical care afterwards. Emergency action plan given. Get bloodwork If negative will schedule for in office food challenge.  We are ordering labs, so please allow 1-2 weeks for the results to come back. With the newly implemented Cures Act, the labs might be visible to you at the same time that they become visible to me. However, I will not address the results until all of the results are back, so please be patient.  In the meantime, continue recommendations in your patient instructions, including avoidance measures (if applicable), until you hear from me.  Reflux.  See handout for lifestyle and dietary modifications. Continue omeprazole 40mg  twice a day.  Follow up in 1 months or sooner if needed.   Covid-19 vaccine: See below for CDC information.  Reducing Pollen Exposure Pollen seasons: trees (spring), grass (summer) and ragweed/weeds (fall). Keep windows closed in your home and car to lower pollen exposure.  Install air conditioning in the bedroom and throughout the house if possible.  Avoid going out in dry windy days - especially early morning. Pollen counts are highest between 5 - 10 AM and on dry, hot and windy days.  Save outside activities for late afternoon or after a heavy rain, when pollen levels are lower.  Avoid mowing of grass if you have grass pollen allergy. Be aware that pollen can also be transported indoors on people and pets.  Dry your clothes in an automatic dryer rather than hanging them outside where they might collect pollen.  Rinse hair and eyes before bedtime. Mold Control Mold and fungi can grow on a variety of surfaces provided certain temperature and moisture  conditions exist.  Outdoor molds grow on plants, decaying vegetation and soil. The major outdoor  mold, Alternaria and Cladosporium, are found in very high numbers during hot and dry conditions. Generally, a late summer - fall peak is seen for common outdoor fungal spores. Rain will temporarily lower outdoor mold spore count, but counts rise rapidly when the rainy period ends. The most important indoor molds are Aspergillus and Penicillium. Dark, humid and poorly ventilated basements are ideal sites for mold growth. The next most common sites of mold growth are the bathroom and the kitchen. Outdoor (Seasonal) Mold Control Use air conditioning and keep windows closed. Avoid exposure to decaying vegetation. Avoid leaf raking. Avoid grain handling. Consider wearing a face mask if working in moldy areas.  Indoor (Perennial) Mold Control  Maintain humidity below 50%. Get rid of mold growth on hard surfaces with water, detergent and, if necessary, 5% bleach (do not mix with other cleaners). Then dry the area completely. If mold covers an area more than 10 square feet, consider hiring an indoor environmental professional. For clothing, washing with soap and water is best. If moldy items cannot be cleaned and dried, throw them away. Remove sources e.g. contaminated carpets. Repair and seal leaking roofs or pipes. Using dehumidifiers in damp basements may be helpful, but empty the water and clean units regularly to prevent mildew from forming. All rooms, especially basements, bathrooms and kitchens, require ventilation and cleaning to deter mold and mildew growth. Avoid carpeting on concrete or damp floors, and storing items in damp areas. Control of House Dust Mite Allergen Dust mite allergens are a common trigger of allergy and asthma symptoms. While they can be found throughout the house, these microscopic creatures thrive in warm, humid environments such as bedding, upholstered furniture and carpeting. Because so much time is spent in the bedroom, it is essential to reduce mite levels there.  Encase  pillows, mattresses, and box springs in special allergen-proof fabric covers or airtight, zippered plastic covers.  Bedding should be washed weekly in hot water (130 F) and dried in a hot dryer. Allergen-proof covers are available for comforters and pillows that can't be regularly washed.  Wash the allergy-proof covers every few months. Minimize clutter in the bedroom. Keep pets out of the bedroom.  Keep humidity less than 50% by using a dehumidifier or air conditioning. You can buy a humidity measuring device called a hygrometer to monitor this.  If possible, replace carpets with hardwood, linoleum, or washable area rugs. If that's not possible, vacuum frequently with a vacuum that has a HEPA filter. Remove all upholstered furniture and non-washable window drapes from the bedroom. Remove all non-washable stuffed toys from the bedroom.  Wash stuffed toys weekly. Pet Allergen Avoidance: Contrary to popular opinion, there are no "hypoallergenic" breeds of dogs or cats. That is because people are not allergic to an animal's hair, but to an allergen found in the animal's saliva, dander (dead skin flakes) or urine. Pet allergy symptoms typically occur within minutes. For some people, symptoms can build up and become most severe 8 to 12 hours after contact with the animal. People with severe allergies can experience reactions in public places if dander has been transported on the pet owners' clothing. Keeping an animal outdoors is only a partial solution, since homes with pets in the yard still have higher concentrations of animal allergens. Before getting a pet, ask your allergist to determine if you are allergic to animals. If your pet is already considered part  of your family, try to minimize contact and keep the pet out of the bedroom and other rooms where you spend a great deal of time. As with dust mites, vacuum carpets often or replace carpet with a hardwood floor, tile or linoleum. High-efficiency  particulate air (HEPA) cleaners can reduce allergen levels over time. While dander and saliva are the source of cat and dog allergens, urine is the source of allergens from rabbits, hamsters, mice and [place] pigs; so ask a non-allergic family member to clean the animal's cage. If you have a pet allergy, talk to your allergist about the potential for allergy immunotherapy (allergy shots). This strategy can often provide long-term relief.

## 2021-09-25 ENCOUNTER — Encounter: Payer: Self-pay | Admitting: Allergy

## 2021-10-22 ENCOUNTER — Encounter: Payer: Self-pay | Admitting: Allergy

## 2021-10-22 ENCOUNTER — Other Ambulatory Visit: Payer: Self-pay | Admitting: *Deleted

## 2021-11-21 ENCOUNTER — Ambulatory Visit: Payer: Federal, State, Local not specified - PPO | Admitting: Internal Medicine

## 2021-12-05 ENCOUNTER — Ambulatory Visit: Payer: Federal, State, Local not specified - PPO | Admitting: Internal Medicine

## 2022-01-06 DIAGNOSIS — K219 Gastro-esophageal reflux disease without esophagitis: Secondary | ICD-10-CM | POA: Insufficient documentation

## 2022-01-06 DIAGNOSIS — R053 Chronic cough: Secondary | ICD-10-CM | POA: Insufficient documentation

## 2022-01-15 ENCOUNTER — Other Ambulatory Visit: Payer: Self-pay | Admitting: *Deleted

## 2022-01-15 MED ORDER — SPIRIVA RESPIMAT 1.25 MCG/ACT IN AERS: 2.0000 | INHALATION_SPRAY | Freq: Every day | RESPIRATORY_TRACT | 0 refills | Status: DC

## 2022-02-18 ENCOUNTER — Other Ambulatory Visit: Payer: Self-pay | Admitting: Allergy

## 2023-03-24 IMAGING — CR DG CHEST 2V
2 series · 2 of 2 positions shown · non-contrast
Comparison: CT chest 07/09/2021. Chest x-ray 06/11/2021. There are
surgical

CLINICAL DATA: Persistent cough.

EXAM:
CHEST - 2 VIEW

[chest pa]
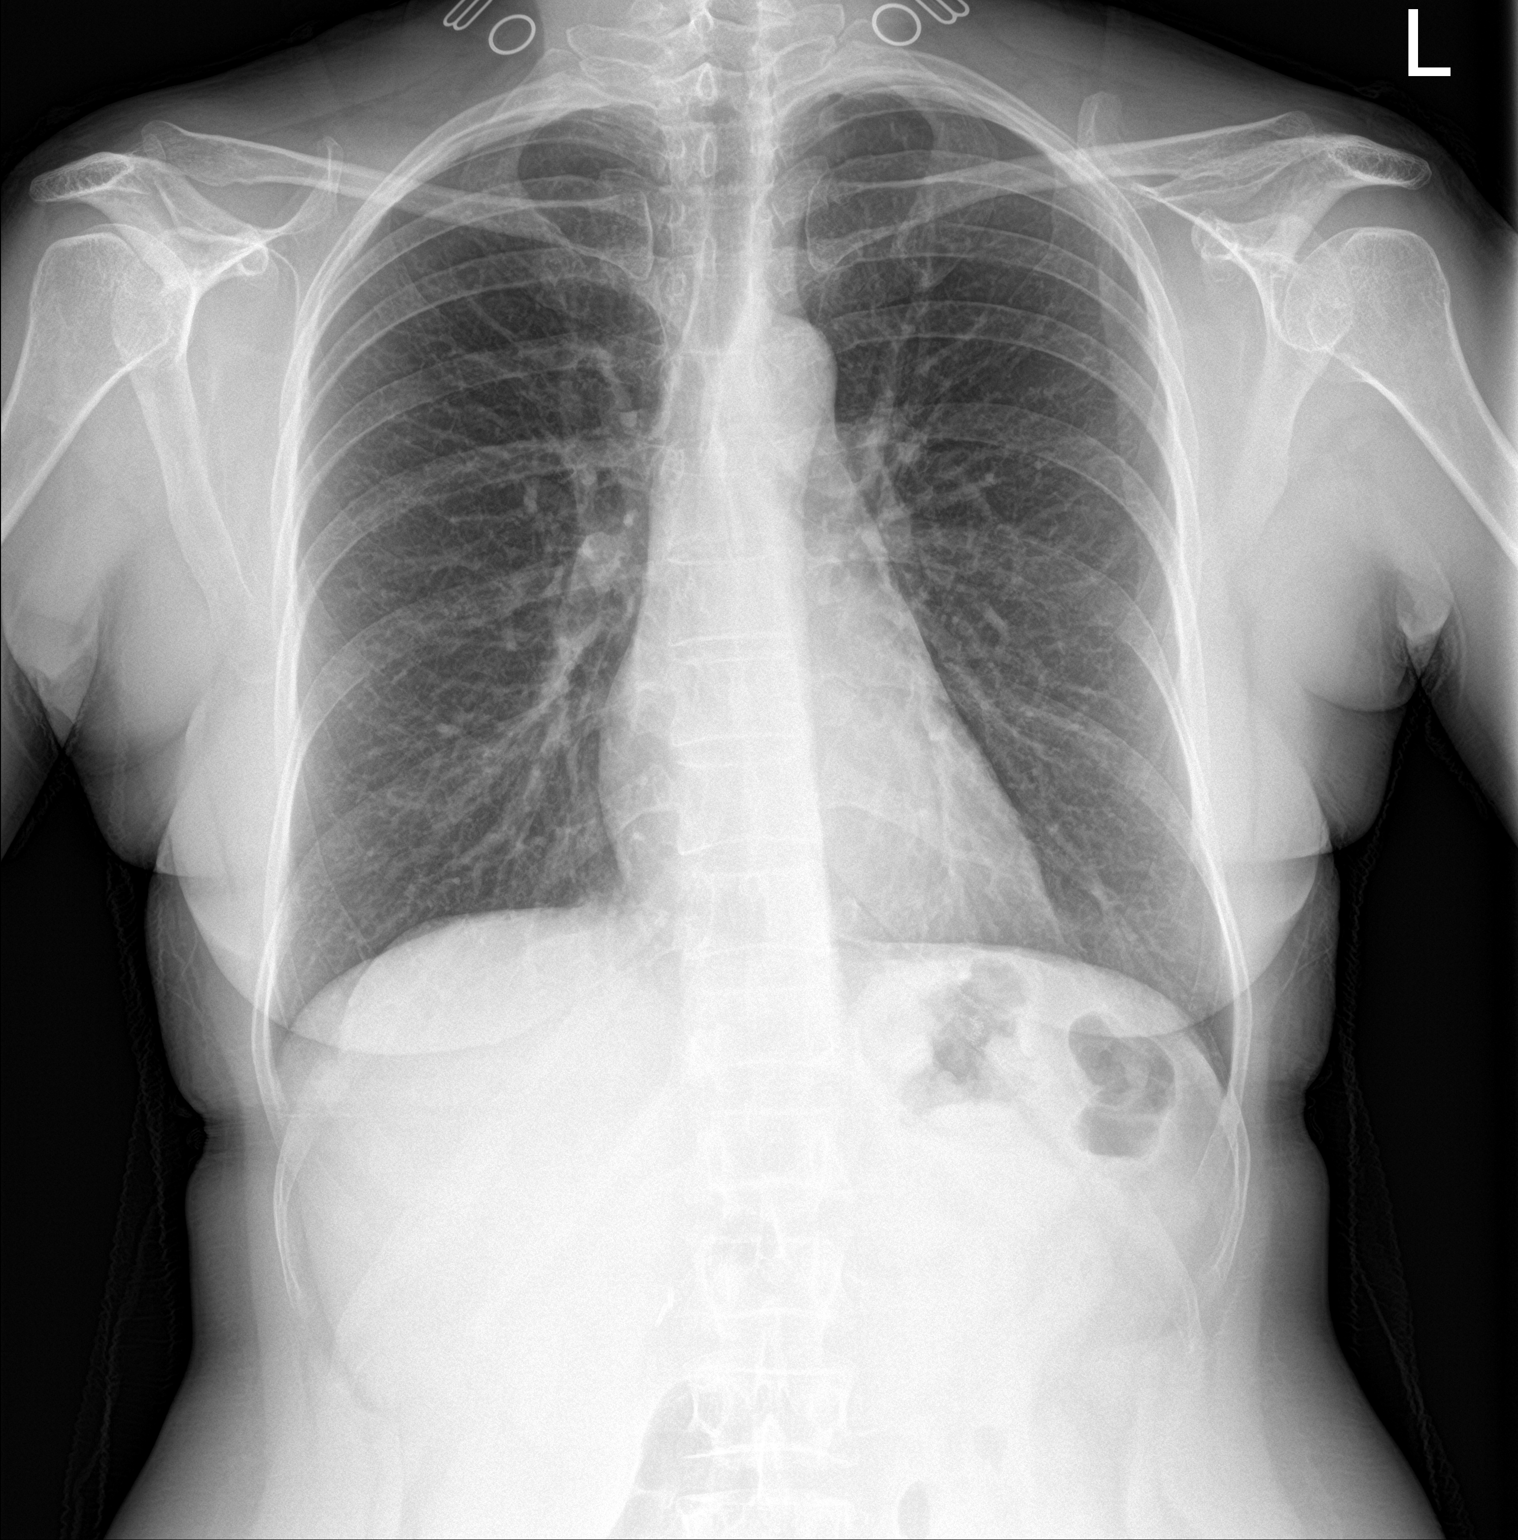

[chest lat]
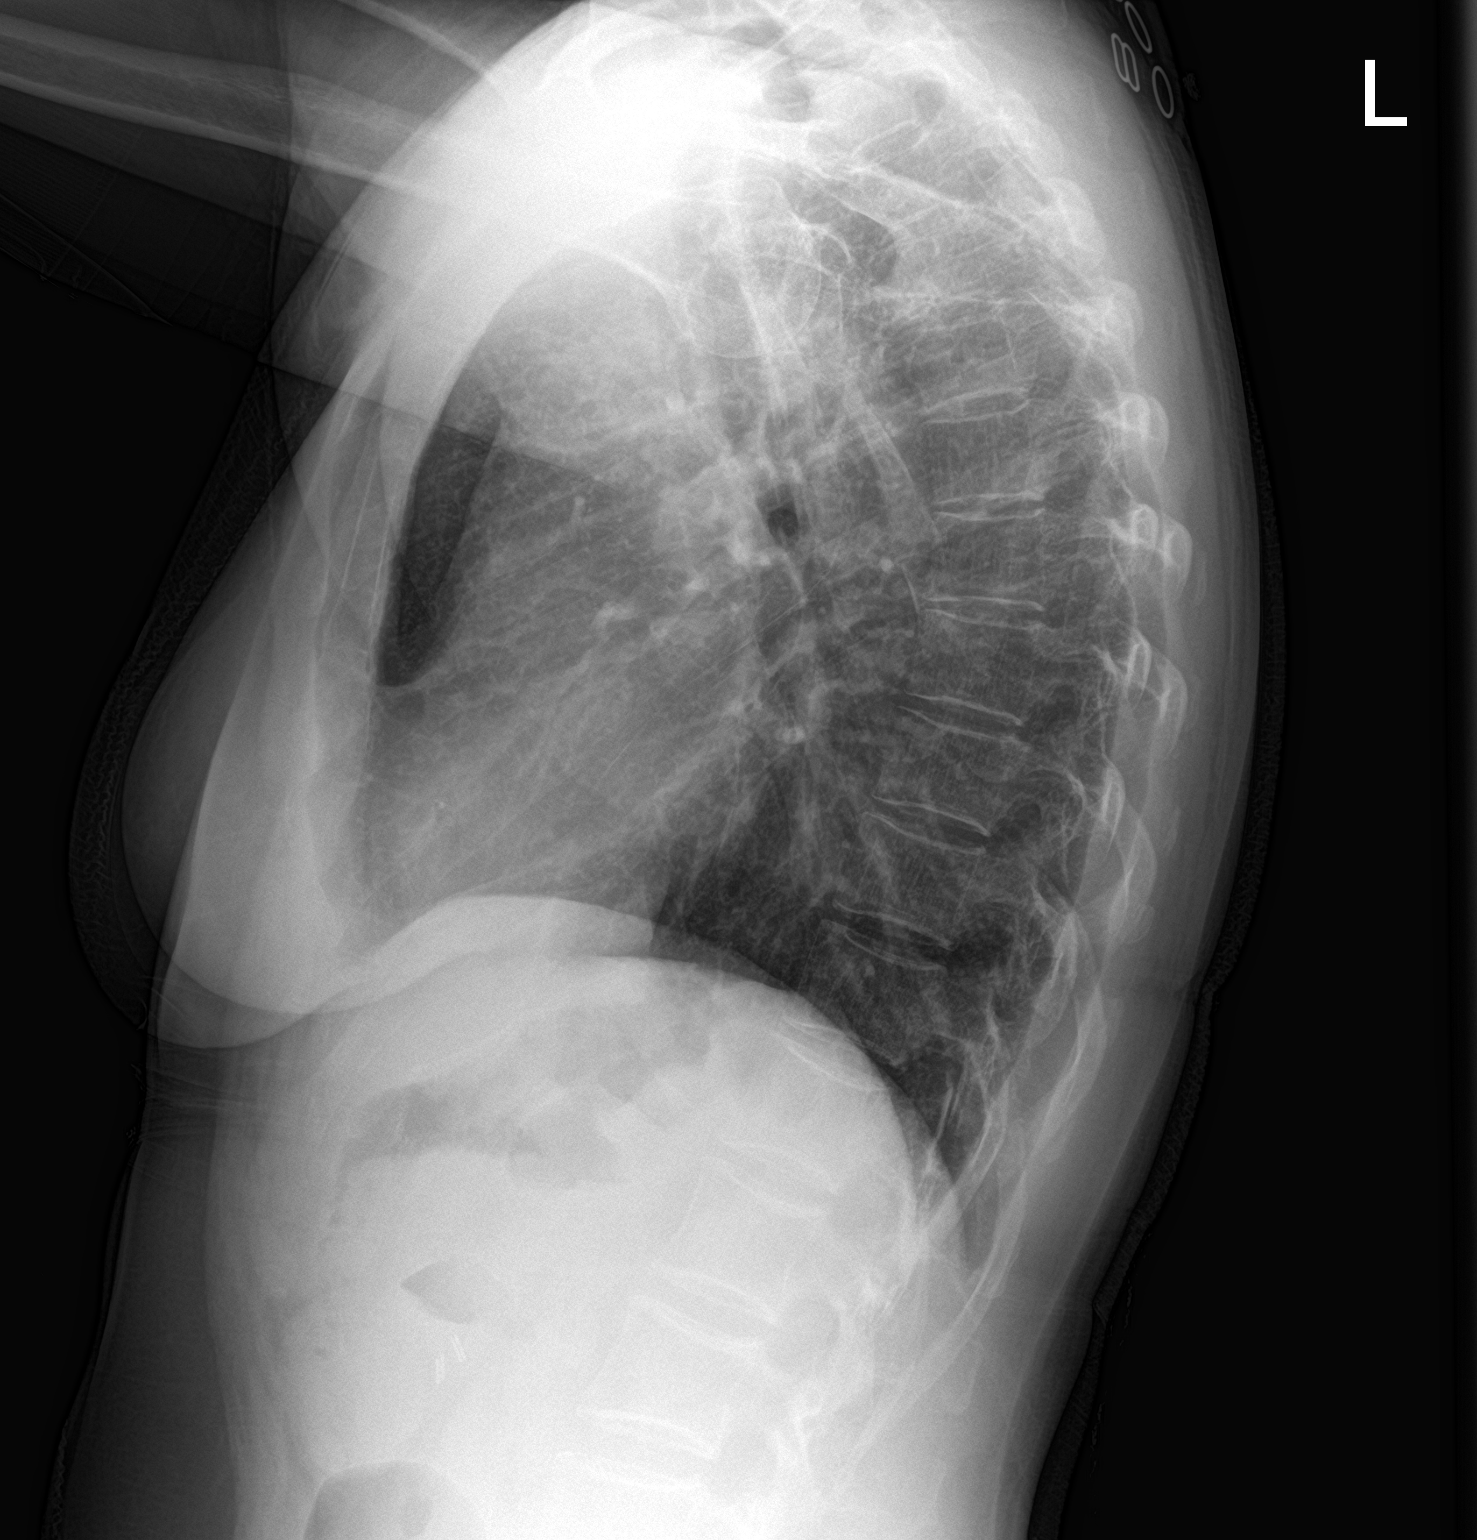

[2 of 2 positions shown; findings below may reference images not displayed]

FINDINGS: The heart size and mediastinal contours are within normal limits.
Both lungs are clear. The visualized skeletal structures are
unremarkable. Clips in the upper abdomen
IMPRESSION: No active cardiopulmonary disease.

## 2023-10-27 ENCOUNTER — Emergency Department (HOSPITAL_BASED_OUTPATIENT_CLINIC_OR_DEPARTMENT_OTHER): Payer: Federal, State, Local not specified - PPO

## 2023-10-27 ENCOUNTER — Emergency Department (HOSPITAL_BASED_OUTPATIENT_CLINIC_OR_DEPARTMENT_OTHER)
Admission: EM | Admit: 2023-10-27 | Discharge: 2023-10-27 | Disposition: A | Discharge status: Home / Self Care | Payer: Federal, State, Local not specified - PPO | Attending: Emergency Medicine | Admitting: Emergency Medicine

## 2023-10-27 ENCOUNTER — Other Ambulatory Visit: Payer: Self-pay

## 2023-10-27 ENCOUNTER — Encounter (HOSPITAL_BASED_OUTPATIENT_CLINIC_OR_DEPARTMENT_OTHER): Payer: Self-pay | Admitting: Emergency Medicine

## 2023-10-27 ENCOUNTER — Encounter: Payer: Self-pay | Admitting: Family Medicine

## 2023-10-27 ENCOUNTER — Other Ambulatory Visit (HOSPITAL_BASED_OUTPATIENT_CLINIC_OR_DEPARTMENT_OTHER): Payer: Self-pay

## 2023-10-27 DIAGNOSIS — S61452A Open bite of left hand, initial encounter: Secondary | ICD-10-CM | POA: Insufficient documentation

## 2023-10-27 DIAGNOSIS — W540XXA Bitten by dog, initial encounter: Secondary | ICD-10-CM | POA: Insufficient documentation

## 2023-10-27 MED ORDER — KETOROLAC TROMETHAMINE 60 MG/2ML IM SOLN
30.0000 mg | Freq: Once | INTRAMUSCULAR | Status: AC
  Administered 2023-10-27: 30 mg via INTRAMUSCULAR
  Filled 2023-10-27: qty 2

## 2023-10-27 MED ORDER — AMOXICILLIN-POT CLAVULANATE 875-125 MG PO TABS
1.0000 | ORAL_TABLET | Freq: Two times a day (BID) | ORAL | 0 refills | Status: DC
  Filled 2023-10-27: qty 14, 7d supply, fill #0

## 2023-10-27 NOTE — Discharge Instructions (Addendum)
Your x-ray imaging showed no evidence of fracture or dislocation.  You likely have localized soft tissue swelling the setting of your dog bite marks.  Clean the wounds with warm soapy water daily, take Augmentin and watch for signs of developing infection which include redness, swelling, worsening pain, fever or chills, purulent drainage.  Take Tylenol and ibuprofen for pain control.

## 2023-10-27 NOTE — ED Notes (Signed)
Pt 's left hand soaking in betadine /ns

## 2023-10-27 NOTE — ED Triage Notes (Signed)
Tetanus up to date

## 2023-10-27 NOTE — ED Provider Notes (Signed)
[place] EMERGENCY DEPARTMENT AT [facility] Provider Note   CSN: [id number] Arrival date & time: 10/27/23  7829     History  No chief complaint on file.   Alisha Smith is a 47 y.o. female.  HPI   47 year old right-handed female presenting to the emergency department after a dog bite to the left hand.  The patient states that her dogs at home are pregnant.  They were somewhat aggressive and she was bitten by a dog earlier this morning on the left hand.  She sustained puncture wounds to the thenar eminence and along the posterior wrist.  Dogs are up-to-date on their vaccines.  Her tetanus is up-to-date.  She has had some pain and swelling in the vicinity of the bites, the bites are hemostatic, no significant lacerations.  She endorses a throbbing sensation in her thumb.  Home Medications Prior to Admission medications   Medication Sig Start Date End Date Taking? Authorizing Provider  amoxicillin-clavulanate (AUGMENTIN) 875-125 MG tablet Take 1 tablet by mouth every 12 (twelve) hours. 10/27/23  Yes Ernie Avena, MD  albuterol (VENTOLIN HFA) 108 (90 Base) MCG/ACT inhaler Inhale into the lungs. 04/21/20   [provider]  benzonatate (TESSALON) 100 MG capsule Take 1 capsule (100 mg total) by mouth 3 (three) times daily as needed. Patient not taking: Reported on 09/23/2021 07/20/21   Margaretann Loveless, PA-C  budesonide-formoterol [facility]) 80-4.5 MCG/ACT inhaler Inhale 2 puffs into the lungs 2 (two) times daily. 07/02/21   [provider]  cetirizine (ZYRTEC) 10 MG tablet Take by mouth. 11/26/16   [provider]  EPINEPHrine 0.3 mg/0.3 mL IJ SOAJ injection Inject 0.3 mg into the muscle as needed for anaphylaxis. 09/23/21   Ellamae Sia, DO  Levothyroxine Sodium 100 MCG CAPS  07/31/21   [provider]  loratadine (CLARITIN) 10 MG tablet Take by mouth.    [provider]  Olopatadine-Mometasone [facility]) [phone] MCG/ACT SUSP Place  1-2 sprays into the nose in the morning and at bedtime. 09/23/21   Ellamae Sia, DO  omeprazole (PRILOSEC) 40 MG capsule Take 40 mg by mouth 2 (two) times daily. 08/15/21   [provider]  predniSONE (DELTASONE) 20 MG tablet Take 2 tablets (40 mg total) by mouth daily with breakfast. Patient not taking: Reported on 09/23/2021 07/20/21   Margaretann Loveless, PA-C  Tiotropium Bromide Monohydrate (SPIRIVA RESPIMAT) 1.25 MCG/ACT AERS Inhale 2 puffs into the lungs daily. 01/15/22   Ellamae Sia, DO  TRINTELLIX 20 MG TABS tablet Take 20 mg by mouth daily. 12/31/19   [provider]      Allergies    Latex, Other, and Peanut oil    Review of Systems   Review of Systems  All other systems reviewed and are negative.   Physical Exam Updated Vital Signs BP (!) 135/93   Pulse 80   Temp 98 F (36.7 C) (Oral)   Resp 20   SpO2 100%  Physical Exam Vitals and nursing note reviewed.  Constitutional:      General: She is not in acute distress. HENT:     Head: Normocephalic and atraumatic.  Eyes:     Conjunctiva/sclera: Conjunctivae normal.     Pupils: Pupils are equal, round, and reactive to light.  Cardiovascular:     Rate and Rhythm: Normal rate and regular rhythm.  Pulmonary:     Effort: Pulmonary effort is normal. No respiratory distress.  Abdominal:     General: There is no distension.  Tenderness: There is no guarding.  Musculoskeletal:        General: No deformity or signs of injury.     Cervical back: Neck supple.     Comments: Multiple puncture wounds along the thenar eminence, along the dorsum of the wrist, hemostatic, patient with some difficulty with thumb opposition, after Toradol, intact motor function along the median, ulnar, radial nerve distributions  Skin:    Findings: No lesion or rash.  Neurological:     General: No focal deficit present.     Mental Status: She is alert. Mental status is at baseline.     ED Results / Procedures / Treatments    Labs (all labs ordered are listed, but only abnormal results are displayed) Labs Reviewed - No data to display  EKG None  Radiology No results found.  Procedures Procedures    Medications Ordered in ED Medications  ketorolac (TORADOL) injection 30 mg (30 mg Intramuscular Given 10/27/23 1038)    ED Course/ Medical Decision Making/ A&P                                 Medical Decision Making Amount and/or Complexity of Data Reviewed Radiology: ordered.  Risk Prescription drug management.    47 year old right-handed female presenting to the emergency department after a dog bite to the left hand.  The patient states that her dogs at home are pregnant.  They were somewhat aggressive and she was bitten by a dog earlier this morning on the left hand.  She sustained puncture wounds to the thenar eminence and along the posterior wrist.  Dogs are up-to-date on their vaccines.  Her tetanus is up-to-date.  She has had some pain and swelling in the vicinity of the bites, the bites are hemostatic, no significant lacerations.  She endorses a throbbing sensation in her thumb.  On arrival, the patient was vitally stable, up to date on tetanus.  Dogs are vaccinated for rabies and under observation at home.  Multiple small puncture wound seen, hemostatic involving the left hand and wrist.  X-ray imaging of the left hand was performed and on my review showed no evidence of acute fracture or dislocation, no evidence of foreign body.   IMPRESSION:  1. No acute fracture.  2. Mild subcutaneous air at the medial aspect of the proximal thumb  and thenar soft tissues consistent with soft tissue injury from the  dog bite. No dense foreign body is seen to suggest a dog tooth.  3. Nonspecific 5 mm smooth, oval calcific density medial to the base  of the middle phalanx of the third finger.    The patient will be discharged on Augmentin, administered Toradol for pain control, discharged and advised  Tylenol and ibuprofen for pain control, outpatient follow-up with return precautions provided.   Final Clinical Impression(s) / ED Diagnoses Final diagnoses:  Dog bite, initial encounter    Rx / DC Orders ED Discharge Orders          Ordered    amoxicillin-clavulanate (AUGMENTIN) 875-125 MG tablet  Every 12 hours        10/27/23 1037              Ernie Avena, MD 10/27/23 1206

## 2023-10-27 NOTE — ED Triage Notes (Signed)
Has pregnant dogs, have been aggressive, she was bitten by her dog on her left hand. Puncture wound to area under thumb and along left posterior wrist. Dogs have rabies vaccine

## 2023-10-27 NOTE — ED Notes (Signed)
Applied bacitracin, non-stick gauze, and kurlex to dress wound per EDP. CNS intact after application.

## 2023-11-02 NOTE — Telephone Encounter (Signed)
Spoke w/pt this morning, regarding msg and xray results. Suggest to pt that she would have to call her previous NP for xray result and address her new issues with them since she is not established w/[facility].  Per pt voiced understanding and stated, "Nehemiah Settle has move location and I have also moved further away from [place]. That's why I made appt w/[facility]. I can see my xray results, however, I still have a an issue. Is there a sooner New Pt w/[facility]?"  Told pt as of right there is no sooner appt for new pt w/[facility]. Pt stated"Thank you and hung up."

## 2023-11-18 ENCOUNTER — Ambulatory Visit: Payer: Federal, State, Local not specified - PPO | Admitting: Family Medicine

## 2023-12-23 DIAGNOSIS — J4550 Severe persistent asthma, uncomplicated: Secondary | ICD-10-CM | POA: Insufficient documentation

## 2023-12-28 DIAGNOSIS — R6882 Decreased libido: Secondary | ICD-10-CM | POA: Insufficient documentation

## 2023-12-28 DIAGNOSIS — F3341 Major depressive disorder, recurrent, in partial remission: Secondary | ICD-10-CM | POA: Insufficient documentation

## 2023-12-28 DIAGNOSIS — F419 Anxiety disorder, unspecified: Secondary | ICD-10-CM | POA: Insufficient documentation

## 2023-12-28 DIAGNOSIS — E119 Type 2 diabetes mellitus without complications: Secondary | ICD-10-CM | POA: Insufficient documentation

## 2023-12-28 DIAGNOSIS — N951 Menopausal and female climacteric states: Secondary | ICD-10-CM | POA: Insufficient documentation

## 2024-01-12 ENCOUNTER — Ambulatory Visit: Payer: Federal, State, Local not specified - PPO | Admitting: Family Medicine

## 2024-01-12 ENCOUNTER — Encounter: Payer: Self-pay | Admitting: Family Medicine

## 2024-01-12 VITALS — BP 130/80 | HR 77 | Temp 98.0°F | Ht 64.0 in | Wt 139.0 lb

## 2024-01-12 DIAGNOSIS — Z1231 Encounter for screening mammogram for malignant neoplasm of breast: Secondary | ICD-10-CM

## 2024-01-12 DIAGNOSIS — Z8639 Personal history of other endocrine, nutritional and metabolic disease: Secondary | ICD-10-CM

## 2024-01-12 DIAGNOSIS — Z833 Family history of diabetes mellitus: Secondary | ICD-10-CM | POA: Insufficient documentation

## 2024-01-12 DIAGNOSIS — A0472 Enterocolitis due to Clostridium difficile, not specified as recurrent: Secondary | ICD-10-CM | POA: Insufficient documentation

## 2024-01-12 DIAGNOSIS — Z13220 Encounter for screening for lipoid disorders: Secondary | ICD-10-CM

## 2024-01-12 DIAGNOSIS — Z6823 Body mass index (BMI) 23.0-23.9, adult: Secondary | ICD-10-CM | POA: Insufficient documentation

## 2024-01-12 DIAGNOSIS — J4550 Severe persistent asthma, uncomplicated: Secondary | ICD-10-CM

## 2024-01-12 DIAGNOSIS — S2232XA Fracture of one rib, left side, initial encounter for closed fracture: Secondary | ICD-10-CM | POA: Insufficient documentation

## 2024-01-12 DIAGNOSIS — K219 Gastro-esophageal reflux disease without esophagitis: Secondary | ICD-10-CM | POA: Diagnosis not present

## 2024-01-12 DIAGNOSIS — L02619 Cutaneous abscess of unspecified foot: Secondary | ICD-10-CM | POA: Insufficient documentation

## 2024-01-12 DIAGNOSIS — K121 Other forms of stomatitis: Secondary | ICD-10-CM | POA: Insufficient documentation

## 2024-01-12 DIAGNOSIS — Z1159 Encounter for screening for other viral diseases: Secondary | ICD-10-CM

## 2024-01-12 DIAGNOSIS — A044 Other intestinal Escherichia coli infections: Secondary | ICD-10-CM | POA: Insufficient documentation

## 2024-01-12 DIAGNOSIS — Z114 Encounter for screening for human immunodeficiency virus [HIV]: Secondary | ICD-10-CM | POA: Diagnosis not present

## 2024-01-12 DIAGNOSIS — R5383 Other fatigue: Secondary | ICD-10-CM | POA: Insufficient documentation

## 2024-01-12 DIAGNOSIS — E538 Deficiency of other specified B group vitamins: Secondary | ICD-10-CM

## 2024-01-12 DIAGNOSIS — R232 Flushing: Secondary | ICD-10-CM | POA: Insufficient documentation

## 2024-01-12 DIAGNOSIS — F329 Major depressive disorder, single episode, unspecified: Secondary | ICD-10-CM | POA: Insufficient documentation

## 2024-01-12 DIAGNOSIS — F4310 Post-traumatic stress disorder, unspecified: Secondary | ICD-10-CM | POA: Insufficient documentation

## 2024-01-12 NOTE — Assessment & Plan Note (Signed)
Working with pulmonology, started on biologics recently.

## 2024-01-12 NOTE — Assessment & Plan Note (Signed)
Followed by Endo, continue Levothyroxine daily. TSH ordered.

## 2024-01-12 NOTE — Patient Instructions (Signed)
It was great to meet you today and I'm excited to have you join the [facility] practice. I hope you had a positive experience today! If you feel so inclined, please feel free to recommend our practice to friends and family. Kurtis Bushman, FNP-C

## 2024-01-12 NOTE — Assessment & Plan Note (Signed)
Followed by GI. Controlled on Omeprazole.

## 2024-01-12 NOTE — Assessment & Plan Note (Signed)
She is unaware of this diagnosis, no elevated A1c recorded. Diagnosis first on her chart in 2018. Will check HM items including A1c.

## 2024-01-12 NOTE — Progress Notes (Signed)
New Patient Office Visit  Subjective    Patient ID: Alisha Smith, female    DOB: [birth year]  Age: 48 y.o. MRN: [id number]  CC: No chief complaint on file.   HPI Alisha Smith presents to establish care. Oriented to practice routines and expectations. Has been seeing PCP regularly and does also see Pulmonology (asthma), Endocrinology (hypothyroidism), [facility], Hematology and Oncology (Leukocytosis), GI (IBS, C. Diff, GERD), infectious disease, and Orthopedics (left shoulder rotator cuff). PMH includes asthma, GERD, anxiety, depression, OCD, PTSD, IBS, interstitial cystitis, Graves disease, and Raynauds. She is working with pulmonology for her asthma and just started biologic therapy for better control.  Breast CA screening: Mammogram status: Completed 04/10/2021. Repeat every year Cervical CA screening:  total abdominal hysterectomy Colon CA screening: colonoscopy 2 years ago  without  abnormalities. Tobacco: non-smoker STI: declines Vaccines:  utd    Outpatient Encounter Medications as of 01/12/2024  Medication Sig   albuterol (VENTOLIN HFA) 108 (90 Base) MCG/ACT inhaler Inhale into the lungs.   budesonide-formoterol (SYMBICORT) 80-4.5 MCG/ACT inhaler Inhale 2 puffs into the lungs 2 (two) times daily.   cetirizine (ZYRTEC) 10 MG tablet Take by mouth.   EPINEPHrine 0.3 mg/0.3 mL IJ SOAJ injection Inject 0.3 mg into the muscle as needed for anaphylaxis.   levothyroxine (SYNTHROID) 112 MCG tablet Take 112 mcg by mouth daily.   loratadine (CLARITIN) 10 MG tablet Take by mouth.   omeprazole (PRILOSEC) 40 MG capsule Take 40 mg by mouth 2 (two) times daily.   amoxicillin-clavulanate (AUGMENTIN) 875-125 MG tablet Take 1 tablet by mouth every 12 (twelve) hours. (Patient not taking: Reported on 01/12/2024)   benzonatate (TESSALON) 100 MG capsule Take 1 capsule (100 mg total) by mouth 3 (three) times daily as needed. (Patient not taking: Reported on 01/12/2024)    Olopatadine-Mometasone (RYALTRIS) 665-25 MCG/ACT SUSP Place 1-2 sprays into the nose in the morning and at bedtime. (Patient not taking: Reported on 01/12/2024)   predniSONE (DELTASONE) 20 MG tablet Take 2 tablets (40 mg total) by mouth daily with breakfast. (Patient not taking: Reported on 01/12/2024)   Tiotropium Bromide Monohydrate (SPIRIVA RESPIMAT) 1.25 MCG/ACT AERS Inhale 2 puffs into the lungs daily. (Patient not taking: Reported on 01/12/2024)   TRINTELLIX 20 MG TABS tablet Take 20 mg by mouth daily. (Patient not taking: Reported on 01/12/2024)   No facility-administered encounter medications on file as of 01/12/2024.    Past Medical History:  Diagnosis Date   Anxiety    Asthma    Depression    Diaphragm injury    GERD (gastroesophageal reflux disease)    Graves disease    IBS (irritable bowel syndrome)    Interstitial cystitis    Liver injury    OCD (obsessive compulsive disorder)    PTSD (post-traumatic stress disorder)    Raynaud disease    TMJ (dislocation of temporomandibular joint)     Past Surgical History:  Procedure Laterality Date   ABDOMINAL HYSTERECTOMY     BLADDER SURGERY     GASTRECTOMY     SPLENECTOMY, TOTAL     TURBINATE REDUCTION      Family History  Problem Relation Age of Onset   Diabetes Father    Breast cancer Maternal Aunt    Breast cancer Paternal Aunt     Social History   Socioeconomic History   Marital status: Single    Spouse name: Not on file   Number of children: Not on file   Years of education: Not on file  Highest education level: Bachelor's degree (e.g., BA, AB, BS)  Occupational History   Not on file  Tobacco Use   Smoking status: Never   Smokeless tobacco: Never  Vaping Use   Vaping status: Never Used  Substance and Sexual Activity   Alcohol use: Not Currently   Drug use: Never   Sexual activity: Not on file  Other Topics Concern   Not on file  Social History Narrative   Not on file   Social Drivers of Health    Financial Resource Strain: Low Risk  (01/11/2024)   Overall Financial Resource Strain (CARDIA)    Difficulty of Paying Living Expenses: Not hard at all  Food Insecurity: No Food Insecurity (01/11/2024)   Hunger Vital Sign    Worried About Running Out of Food in the Last Year: Never true    Ran Out of Food in the Last Year: Never true  Transportation Needs: No Transportation Needs (01/11/2024)   PRAPARE - Administrator, Civil Service (Medical): No    Lack of Transportation (Non-Medical): No  Physical Activity: Unknown (01/11/2024)   Exercise Vital Sign    Days of Exercise per Week: 0 days    Minutes of Exercise per Session: Not on file  Stress: Stress Concern Present (01/11/2024)   [employer] of Occupational Health - Occupational Stress Questionnaire    Feeling of Stress : To some extent  Social Connections: Unknown (01/11/2024)   Social Connection and Isolation Panel [NHANES]    Frequency of Communication with Friends and Family: Once a week    Frequency of Social Gatherings with Friends and Family: Patient declined    Attends Religious Services: 1 to 4 times per year    Active Member of [employer] or Organizations: No    Attends Engineer, structural: Not on file    Marital Status: Married  Catering manager Violence: Not At Risk (03/17/2023)   Received from [facility]   HITS    Over the last 12 months how often did your partner physically hurt you?: Never    Over the last 12 months how often did your partner insult you or talk down to you?: Never    Over the last 12 months how often did your partner threaten you with physical harm?: Never    Over the last 12 months how often did your partner scream or curse at you?: Never    Review of Systems  Respiratory:  Positive for shortness of breath.   Gastrointestinal:  Positive for diarrhea.  Musculoskeletal:  Positive for myalgias.  Psychiatric/Behavioral:  Positive for depression and memory loss. The patient is  nervous/anxious.   All other systems reviewed and are negative.       Objective    BP 130/80   Pulse 77   Temp 98 F (36.7 C) (Oral)   Ht 5\' 4"  (1.626 m)   Wt 139 lb (63 kg)   SpO2 98%   BMI 23.86 kg/m   Physical Exam Vitals and nursing note reviewed.  Constitutional:      Appearance: Normal appearance. She is normal weight.  HENT:     Head: Normocephalic and atraumatic.  Cardiovascular:     Rate and Rhythm: Normal rate and regular rhythm.     Pulses: Normal pulses.     Heart sounds: Normal heart sounds.  Pulmonary:     Effort: Pulmonary effort is normal.     Breath sounds: Normal breath sounds.  Skin:    General: Skin is  warm and dry.  Neurological:     General: No focal deficit present.     Mental Status: She is alert and oriented to person, place, and time. Mental status is at baseline.  Psychiatric:        Mood and Affect: Mood normal.        Behavior: Behavior normal.        Thought Content: Thought content normal.        Judgment: Judgment normal.         Assessment & Plan:   Problem List Items Addressed This Visit     GERD (gastroesophageal reflux disease) - Primary   Followed by GI. Controlled on Omeprazole.       Relevant Orders   CBC with Differential/Platelet   COMPLETE METABOLIC PANEL WITH GFR   Lipid panel   History of Graves' disease   Followed by Endo, continue Levothyroxine daily. TSH ordered.      Relevant Orders   CBC with Differential/Platelet   COMPLETE METABOLIC PANEL WITH GFR   Lipid panel   TSH   Poorly controlled severe persistent asthma without complication   Working with pulmonology, recently started biologic therapy      Family history of diabetes mellitus   Relevant Orders   CBC with Differential/Platelet   COMPLETE METABOLIC PANEL WITH GFR   Lipid panel   Hemoglobin A1c   Other Visit Diagnoses       Encounter for screening mammogram for malignant neoplasm of breast       Relevant Orders   MM  DIGITAL SCREENING BILATERAL     Screening for HIV (human immunodeficiency virus)       Relevant Orders   HIV Antibody (routine testing w rflx)     Need for hepatitis C screening test       Relevant Orders   Hepatitis C antibody     Screening for lipoid disorders       Relevant Orders   Lipid panel     B12 deficiency       Relevant Orders   Vitamin B12       Return for annual physical with labs 1 week prior.   Park Meo, FNP

## 2024-01-12 NOTE — Assessment & Plan Note (Signed)
Working with pulmonology, recently started biologic therapy

## 2024-01-17 ENCOUNTER — Encounter: Payer: Self-pay | Admitting: Family Medicine

## 2024-01-26 ENCOUNTER — Ambulatory Visit: Payer: Federal, State, Local not specified - PPO

## 2024-02-02 ENCOUNTER — Ambulatory Visit
Admission: RE | Admit: 2024-02-02 | Discharge: 2024-02-02 | Disposition: A | Discharge status: Home / Self Care | Payer: Federal, State, Local not specified - PPO | Source: Ambulatory Visit | Attending: Family Medicine | Admitting: Family Medicine

## 2024-02-02 DIAGNOSIS — Z1231 Encounter for screening mammogram for malignant neoplasm of breast: Secondary | ICD-10-CM

## 2024-02-18 ENCOUNTER — Other Ambulatory Visit: Payer: Federal, State, Local not specified - PPO

## 2024-02-19 LAB — COMPLETE METABOLIC PANEL WITH GFR
AG Ratio: 1.4 (calc) (ref 1.0–2.5)
ALT: 13 U/L (ref 6–29)
AST: 15 U/L (ref 10–35)
Albumin: 4.3 g/dL (ref 3.6–5.1)
Alkaline phosphatase (APISO): 50 U/L (ref 31–125)
BUN: 20 mg/dL (ref 7–25)
CO2: 28 mmol/L (ref 20–32)
Calcium: 9.8 mg/dL (ref 8.6–10.2)
Chloride: 104 mmol/L (ref 98–110)
Creat: 0.79 mg/dL (ref 0.50–0.99)
Globulin: 3 g/dL (ref 1.9–3.7)
Glucose, Bld: 81 mg/dL (ref 65–99)
Potassium: 4.4 mmol/L (ref 3.5–5.3)
Sodium: 140 mmol/L (ref 135–146)
Total Bilirubin: 0.5 mg/dL (ref 0.2–1.2)
Total Protein: 7.3 g/dL (ref 6.1–8.1)

## 2024-02-19 LAB — CBC WITH DIFFERENTIAL/PLATELET
Absolute Lymphocytes: 2546 {cells}/uL (ref 850–3900)
Absolute Monocytes: 550 {cells}/uL (ref 200–950)
Basophils Absolute: 95 {cells}/uL (ref 0–200)
Basophils Relative: 1.1 %
Eosinophils Absolute: 43 {cells}/uL (ref 15–500)
Eosinophils Relative: 0.5 %
HCT: 41 % (ref 35.0–45.0)
Hemoglobin: 13.5 g/dL (ref 11.7–15.5)
MCH: 30.8 pg (ref 27.0–33.0)
MCHC: 32.9 g/dL (ref 32.0–36.0)
MCV: 93.6 fL (ref 80.0–100.0)
MPV: 11.8 fL (ref 7.5–12.5)
Monocytes Relative: 6.4 %
Neutro Abs: 5366 {cells}/uL (ref 1500–7800)
Neutrophils Relative %: 62.4 %
Platelets: 247 10*3/uL (ref 140–400)
RBC: 4.38 10*6/uL (ref 3.80–5.10)
RDW: 12.1 % (ref 11.0–15.0)
Total Lymphocyte: 29.6 %
WBC: 8.6 10*3/uL (ref 3.8–10.8)

## 2024-02-19 LAB — HEPATITIS C ANTIBODY: Hepatitis C Ab: NONREACTIVE

## 2024-02-19 LAB — TSH: TSH: 0.92 m[IU]/L

## 2024-02-19 LAB — LIPID PANEL
Cholesterol: 180 mg/dL (ref <200)
HDL: 68 mg/dL (ref >=50)
LDL Cholesterol (Calc): 98 mg/dL
Non-HDL Cholesterol (Calc): 112 mg/dL (ref <130)
Total CHOL/HDL Ratio: 2.6 (calc) (ref <5.0)
Triglycerides: 59 mg/dL (ref <150)

## 2024-02-19 LAB — HIV ANTIBODY (ROUTINE TESTING W REFLEX): HIV 1&2 Ab, 4th Generation: NONREACTIVE

## 2024-02-19 LAB — HEMOGLOBIN A1C
Hgb A1c MFr Bld: 5.7 %{Hb} — ABNORMAL HIGH (ref <5.7)
Mean Plasma Glucose: 117 mg/dL
eAG (mmol/L): 6.5 mmol/L

## 2024-02-19 LAB — VITAMIN B12: Vitamin B-12: 437 pg/mL (ref 200–1100)

## 2024-02-22 ENCOUNTER — Ambulatory Visit: Payer: Federal, State, Local not specified - PPO | Admitting: Family Medicine

## 2024-02-22 ENCOUNTER — Encounter: Payer: Self-pay | Admitting: Family Medicine

## 2024-02-22 VITALS — BP 120/72 | HR 94 | Temp 98.5°F | Ht 64.0 in | Wt 136.2 lb

## 2024-02-22 DIAGNOSIS — R7303 Prediabetes: Secondary | ICD-10-CM | POA: Diagnosis not present

## 2024-02-22 DIAGNOSIS — Z0000 Encounter for general adult medical examination without abnormal findings: Secondary | ICD-10-CM | POA: Insufficient documentation

## 2024-02-22 DIAGNOSIS — K219 Gastro-esophageal reflux disease without esophagitis: Secondary | ICD-10-CM

## 2024-02-22 DIAGNOSIS — Z23 Encounter for immunization: Secondary | ICD-10-CM

## 2024-02-22 DIAGNOSIS — Z0001 Encounter for general adult medical examination with abnormal findings: Secondary | ICD-10-CM
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
## 2024-02-22 NOTE — Assessment & Plan Note (Signed)
 Followed by GI. Controlled on Omeprazole.

## 2024-02-22 NOTE — Assessment & Plan Note (Signed)

## 2024-02-22 NOTE — Assessment & Plan Note (Signed)
 A1c 5.7%. I recommend consuming a heart healthy diet such as Mediterranean diet or DASH diet with whole grains, fruits, vegetable, fish, lean meats, nuts, and olive oil. Limit sweets and processed foods. I also encourage moderate intensity exercise 150 minutes weekly. This is 3-5 times weekly for 30-50 minutes each session. Goal should be pace of 3 miles/hours, or walking 1.5 miles in 30 minutes.
